# Patient Record
Sex: Female | Born: 1980
Health system: Southern US, Community
[De-identification: ages and names within clinical notes are randomized; demographics above are authoritative.]

## PROBLEM LIST (undated history)

## (undated) DIAGNOSIS — Z789 Other specified health status: Secondary | ICD-10-CM

## (undated) DIAGNOSIS — D509 Iron deficiency anemia, unspecified: Secondary | ICD-10-CM

## (undated) DIAGNOSIS — O99019 Anemia complicating pregnancy, unspecified trimester: Secondary | ICD-10-CM

## (undated) HISTORY — PX: NO PAST SURGERIES: SHX2092

---

## 2010-11-07 NOTE — L&D Delivery Note (Signed)
Delivery Note At 5:07 AM a viable and healthy female was delivered via Vaginal, Spontaneous Delivery (Presentation:ROA ;  ).  APGAR: 9, 9; weight 5 lb 10.3 oz (2560 g).   Placenta status: Intact, Spontaneous.to Path due to IUGR. Light mec noted  Cord: 3 vessels with the following complications: None.  Cord pH: none  Anesthesia: Epidural  Episiotomy: none Lacerations: first Suture Repair: 3.0 chromic Est. Blood Loss (mL): 250cc  Mom to postpartum.  Baby to nursery-stable.  Amy Walsh A 08/18/2011, 5:59 AM

## 2011-01-10 LAB — HEPATITIS B SURFACE ANTIGEN: Hepatitis B Surface Ag: NEGATIVE

## 2011-01-10 LAB — ABO/RH: RH Type: POSITIVE

## 2011-01-10 LAB — ANTIBODY SCREEN: Antibody Screen: NEGATIVE

## 2011-01-10 LAB — HIV ANTIBODY (ROUTINE TESTING W REFLEX): HIV: NONREACTIVE

## 2011-05-19 ENCOUNTER — Inpatient Hospital Stay (HOSPITAL_COMMUNITY): Admission: AD | Admit: 2011-05-19 | Payer: Self-pay | Source: Ambulatory Visit | Admitting: Obstetrics and Gynecology

## 2011-07-26 LAB — STREP B DNA PROBE: GBS: POSITIVE

## 2011-08-05 ENCOUNTER — Other Ambulatory Visit (HOSPITAL_COMMUNITY): Payer: Self-pay | Admitting: Obstetrics and Gynecology

## 2011-08-05 DIAGNOSIS — IMO0002 Reserved for concepts with insufficient information to code with codable children: Secondary | ICD-10-CM

## 2011-08-09 ENCOUNTER — Encounter (HOSPITAL_COMMUNITY): Payer: Self-pay

## 2011-08-09 ENCOUNTER — Other Ambulatory Visit (HOSPITAL_COMMUNITY): Payer: Self-pay | Admitting: Obstetrics and Gynecology

## 2011-08-09 ENCOUNTER — Ambulatory Visit (HOSPITAL_COMMUNITY)
Admission: RE | Admit: 2011-08-09 | Discharge: 2011-08-09 | Disposition: A | Discharge status: Home / Self Care | Payer: 59 | Source: Ambulatory Visit | Attending: Obstetrics and Gynecology | Admitting: Obstetrics and Gynecology

## 2011-08-09 VITALS — BP 141/81 | HR 83 | Wt 145.0 lb

## 2011-08-09 DIAGNOSIS — IMO0002 Reserved for concepts with insufficient information to code with codable children: Secondary | ICD-10-CM

## 2011-08-09 DIAGNOSIS — Z3689 Encounter for other specified antenatal screening: Secondary | ICD-10-CM | POA: Insufficient documentation

## 2011-08-09 DIAGNOSIS — O365990 Maternal care for other known or suspected poor fetal growth, unspecified trimester, not applicable or unspecified: Secondary | ICD-10-CM | POA: Insufficient documentation

## 2011-08-09 NOTE — Progress Notes (Signed)
MATERNAL FETAL MEDICINE CONSULT  Patient Name: Amy Walsh Medical Record Number:  [id number] Date of Birth: 03-09-1981 Requesting Physician Name:  Serita Kyle, MD Date of Service: 08/09/2011  Chief Complaint IUGR    History of Present Illness Amy Walsh was seen today for prenatal diagnosis secondary to IUGR  at the request of Dr. Cherly Hensen.  The patient is a 30 y.o. No obstetric history on file., with an EDD of Not found. dating method.   Review of prior office visits and serial fetal ultrasound studies suggest impaired fetal growth in the third trimester, <10 percentile.  Her prior pregnancy was at term with a 3.1 kg infant, no complications.  She works full time in a somewhat stressful medical lab setting and takes care of her toddler child at home as well.  She dmies any medical issues, does not use ETOH or tobacco or drugs.  Review of Systems A comprehensive review of systems was negative.  Patient History See above     No family history on file. In addition, the patient has no family history of mental retardation, birth defects, or genetic diseases.  Physical Examination There were no vitals filed for this visit. General appearance - alert, well appearing, and in no distress  Ultrasound study: (In AS/EPIC) Single fetus, cephalic presentation, no apparent anatomic abnormalities and normal UA velocimetry and amniotic fluid indices .  Estimated fetal weight is 2.25 kg, < 3% for gestation at 37.5 weeks.  Assessment and Recommendations 1. Suspected IUGR vs. Constitutionally small fetus.  A, I Discussed in detail the implications for risk if the fetus is IUGR, including stillbirth, chronic hypoxia, potential for developmental problems.  Also discussed was the potential for a constitutionally small but otherwise normal fetus at late gestation.  I pointed out that standard of care best  practice is to recommend delivery at 37-[redacted] weeks gestation if EFW is less  than 3%le at early term, and I recommended that approach to her as she is almost [redacted] weeks pregnant.   B. Alternatively, twice weekly NST's and AFI or BPP could be performed up to 39 weeks, especially if the cervix is unfavorable for induction of labor.  With a previous normal term delivery, I pointed out that induction is usually not a problem at 38 weeks.  I spent 20 minutes in face to face consultation with the patient. Thank you for the opportunity to work withDiana     Amy Walsh,Amy Walsh

## 2011-08-09 NOTE — Progress Notes (Signed)
Encounter addended by: Marlana Latus, RN on: 08/09/2011  9:56 AM<BR>     Documentation filed: Episodes

## 2011-08-12 ENCOUNTER — Other Ambulatory Visit (HOSPITAL_COMMUNITY): Payer: Self-pay | Admitting: Obstetrics and Gynecology

## 2011-08-17 ENCOUNTER — Inpatient Hospital Stay (HOSPITAL_COMMUNITY)
Admission: AD | Admit: 2011-08-17 | Discharge: 2011-08-20 | DRG: 775 | Disposition: A | Discharge status: Home / Self Care | Payer: 59 | Source: Ambulatory Visit | Attending: Obstetrics and Gynecology | Admitting: Obstetrics and Gynecology

## 2011-08-17 ENCOUNTER — Encounter (HOSPITAL_COMMUNITY): Payer: Self-pay | Admitting: *Deleted

## 2011-08-17 DIAGNOSIS — D509 Iron deficiency anemia, unspecified: Secondary | ICD-10-CM | POA: Diagnosis present

## 2011-08-17 DIAGNOSIS — O9903 Anemia complicating the puerperium: Secondary | ICD-10-CM | POA: Diagnosis not present

## 2011-08-17 DIAGNOSIS — O365990 Maternal care for other known or suspected poor fetal growth, unspecified trimester, not applicable or unspecified: Secondary | ICD-10-CM | POA: Diagnosis present

## 2011-08-17 DIAGNOSIS — D62 Acute posthemorrhagic anemia: Secondary | ICD-10-CM | POA: Diagnosis not present

## 2011-08-17 HISTORY — DX: Anemia complicating pregnancy, unspecified trimester: O99.019

## 2011-08-17 HISTORY — DX: Other specified health status: Z78.9

## 2011-08-17 HISTORY — DX: Iron deficiency anemia, unspecified: D50.9

## 2011-08-17 LAB — CBC
HCT: 31.8 % — ABNORMAL LOW (ref 36.0–46.0)
Hemoglobin: 10.2 g/dL — ABNORMAL LOW (ref 12.0–15.0)
MCHC: 32.1 g/dL (ref 30.0–36.0)
MCV: 81.5 fL (ref 78.0–100.0)
RDW: 14.5 % (ref 11.5–15.5)

## 2011-08-17 MED ORDER — OXYTOCIN 10 UNIT/ML IJ SOLN: 10.0000 [IU] | Freq: Once | INTRAMUSCULAR | Status: DC

## 2011-08-17 MED ORDER — ACETAMINOPHEN 325 MG PO TABS: 650.0000 mg | ORAL_TABLET | ORAL | Status: DC | PRN

## 2011-08-17 MED ORDER — TERBUTALINE SULFATE 1 MG/ML IJ SOLN: 0.2500 mg | Freq: Once | INTRAMUSCULAR | Status: AC | PRN

## 2011-08-17 MED ORDER — PENICILLIN G POTASSIUM 5000000 UNITS IJ SOLR
5.0000 10*6.[IU] | Freq: Once | INTRAVENOUS | Status: DC
  Administered 2011-08-17: 5 10*6.[IU] via INTRAVENOUS
  Filled 2011-08-17: qty 5

## 2011-08-17 MED ORDER — OXYTOCIN 20 UNITS IN LACTATED RINGERS INFUSION - SIMPLE: 1.0000 m[IU]/min | INTRAVENOUS | Status: DC

## 2011-08-17 MED ORDER — PENICILLIN G POTASSIUM 5000000 UNITS IJ SOLR
2.5000 10*6.[IU] | INTRAVENOUS | Status: DC
  Administered 2011-08-18 (×2): 2.5 10*6.[IU] via INTRAVENOUS
  Filled 2011-08-17 (×5): qty 2.5

## 2011-08-17 MED ORDER — CITRIC ACID-SODIUM CITRATE 334-500 MG/5ML PO SOLN: 30.0000 mL | ORAL | Status: DC | PRN

## 2011-08-17 MED ORDER — ZOLPIDEM TARTRATE 10 MG PO TABS
10.0000 mg | ORAL_TABLET | Freq: Every evening | ORAL | Status: DC | PRN
  Administered 2011-08-17: 10 mg via ORAL
  Filled 2011-08-17: qty 1

## 2011-08-17 MED ORDER — MISOPROSTOL 25 MCG QUARTER TABLET
25.0000 ug | ORAL_TABLET | ORAL | Status: DC | PRN
  Administered 2011-08-17 – 2011-08-18 (×2): 25 ug via VAGINAL
  Filled 2011-08-17 (×2): qty 0.25

## 2011-08-17 MED ORDER — OXYTOCIN 20 UNITS IN LACTATED RINGERS INFUSION - SIMPLE: 125.0000 mL/h | Freq: Once | INTRAVENOUS | Status: DC

## 2011-08-17 MED ORDER — OXYTOCIN BOLUS FROM INFUSION
500.0000 mL | Freq: Once | INTRAVENOUS | Status: DC
  Filled 2011-08-17: qty 500
  Filled 2011-08-17: qty 1000

## 2011-08-17 MED ORDER — LACTATED RINGERS IV SOLN: 500.0000 mL | INTRAVENOUS | Status: DC | PRN

## 2011-08-17 MED ORDER — OXYCODONE-ACETAMINOPHEN 5-325 MG PO TABS: 2.0000 | ORAL_TABLET | ORAL | Status: DC | PRN

## 2011-08-17 MED ORDER — LIDOCAINE HCL (PF) 1 % IJ SOLN
30.0000 mL | INTRAMUSCULAR | Status: DC | PRN
  Administered 2011-08-18: 30 mL via SUBCUTANEOUS
  Filled 2011-08-17: qty 30

## 2011-08-17 MED ORDER — LACTATED RINGERS IV SOLN
INTRAVENOUS | Status: DC
  Administered 2011-08-17 – 2011-08-18 (×2): via INTRAVENOUS

## 2011-08-17 MED ORDER — IBUPROFEN 600 MG PO TABS: 600.0000 mg | ORAL_TABLET | Freq: Four times a day (QID) | ORAL | Status: DC | PRN

## 2011-08-17 MED ORDER — ONDANSETRON HCL 4 MG/2ML IJ SOLN: 4.0000 mg | Freq: Four times a day (QID) | INTRAMUSCULAR | Status: DC | PRN

## 2011-08-17 NOTE — H&P (Signed)
Amy Walsh is a 30 y.o. female  Now @ [redacted] wk gestation presenting for induction 2nd to IUGR. History OB History    Grav Para Term Preterm Abortions TAB SAB Ect Mult Living   2 1 1  0 0 0 0 0 0 1     Past Medical History  Diagnosis Date  . No pertinent past medical history    Past Surgical History  Procedure Date  . No past surgeries    Family History: family history is not on file. Social History:  does not have a smoking history on file. She does not have any smokeless tobacco history on file. Her alcohol and drug histories not on file.  Review of Systems  Genitourinary: Negative.   All other systems reviewed and are negative.    Dilation: 1 Effacement (%): 60 Station: -2 Exam by:: LCarpenter,RN Blood pressure 137/88, pulse 102, temperature 98.8 F (37.1 C), temperature source Oral, resp. rate 18, height 5\' 4"  (1.626 m), weight 146 lb (66.225 kg), last menstrual period 11/18/2010. Exam Physical Exam  Constitutional: She is oriented to person, place, and time. She appears well-developed and well-nourished.  HENT:  Head: Normocephalic.  Cardiovascular: Normal rate and regular rhythm.   Respiratory: Breath sounds normal.  GI: Soft.  Musculoskeletal: Normal range of motion.  Neurological: She is alert and oriented to person, place, and time.  Skin: Skin is warm and dry.    Prenatal labs: ABO, Rh: A/Positive/-- (03/05 0000) Antibody: Negative (03/05 0000) Rubella: Immune (03/05 0000) RPR: Nonreactive (08/08 0000)  HBsAg: Negative (03/05 0000)  HIV: Non-reactive (03/05 0000)  GBS: Positive (09/18 0000)   Assessment/Plan: IUGR GBS cx(+) IUP @39  wk  P) Admit 2) cytotec induction 3) routine labs. 4) PCN prophylaxis   Kirstine Jacquin A 08/17/2011, 9:05 PM

## 2011-08-18 ENCOUNTER — Encounter (HOSPITAL_COMMUNITY): Payer: Self-pay | Admitting: Anesthesiology

## 2011-08-18 ENCOUNTER — Encounter (HOSPITAL_COMMUNITY): Payer: Self-pay | Admitting: *Deleted

## 2011-08-18 ENCOUNTER — Inpatient Hospital Stay (HOSPITAL_COMMUNITY): Payer: 59 | Admitting: Anesthesiology

## 2011-08-18 ENCOUNTER — Other Ambulatory Visit: Payer: Self-pay | Admitting: Obstetrics and Gynecology

## 2011-08-18 MED ORDER — PRENATAL PLUS 27-1 MG PO TABS
1.0000 | ORAL_TABLET | Freq: Every day | ORAL | Status: DC
  Administered 2011-08-18 – 2011-08-20 (×3): 1 via ORAL
  Filled 2011-08-18 (×3): qty 1

## 2011-08-18 MED ORDER — EPHEDRINE 5 MG/ML INJ: 10.0000 mg | INTRAVENOUS | Status: DC | PRN

## 2011-08-18 MED ORDER — LANOLIN HYDROUS EX OINT: TOPICAL_OINTMENT | CUTANEOUS | Status: DC | PRN

## 2011-08-18 MED ORDER — BENZOCAINE-MENTHOL 20-0.5 % EX AERO
1.0000 "application " | INHALATION_SPRAY | CUTANEOUS | Status: DC | PRN
  Filled 2011-08-18: qty 56

## 2011-08-18 MED ORDER — BUTORPHANOL TARTRATE 2 MG/ML IJ SOLN
2.0000 mg | INTRAMUSCULAR | Status: DC | PRN
  Administered 2011-08-18: 2 mg via INTRAVENOUS
  Filled 2011-08-18: qty 1

## 2011-08-18 MED ORDER — PHENYLEPHRINE 40 MCG/ML (10ML) SYRINGE FOR IV PUSH (FOR BLOOD PRESSURE SUPPORT): 80.0000 ug | PREFILLED_SYRINGE | INTRAVENOUS | Status: DC | PRN

## 2011-08-18 MED ORDER — FENTANYL 2.5 MCG/ML BUPIVACAINE 1/10 % EPIDURAL INFUSION (WH - ANES)
14.0000 mL/h | INTRAMUSCULAR | Status: DC
  Filled 2011-08-18: qty 60

## 2011-08-18 MED ORDER — ZOLPIDEM TARTRATE 5 MG PO TABS: 5.0000 mg | ORAL_TABLET | Freq: Every evening | ORAL | Status: DC | PRN

## 2011-08-18 MED ORDER — ONDANSETRON HCL 4 MG/2ML IJ SOLN: 4.0000 mg | INTRAMUSCULAR | Status: DC | PRN

## 2011-08-18 MED ORDER — PHENYLEPHRINE 40 MCG/ML (10ML) SYRINGE FOR IV PUSH (FOR BLOOD PRESSURE SUPPORT)
80.0000 ug | PREFILLED_SYRINGE | INTRAVENOUS | Status: DC | PRN
  Filled 2011-08-18: qty 5

## 2011-08-18 MED ORDER — WITCH HAZEL-GLYCERIN EX PADS: 1.0000 "application " | MEDICATED_PAD | CUTANEOUS | Status: DC | PRN

## 2011-08-18 MED ORDER — DIBUCAINE 1 % RE OINT
1.0000 "application " | TOPICAL_OINTMENT | RECTAL | Status: DC | PRN
  Filled 2011-08-18: qty 28

## 2011-08-18 MED ORDER — ONDANSETRON HCL 4 MG PO TABS: 4.0000 mg | ORAL_TABLET | ORAL | Status: DC | PRN

## 2011-08-18 MED ORDER — SENNOSIDES-DOCUSATE SODIUM 8.6-50 MG PO TABS
2.0000 | ORAL_TABLET | Freq: Every day | ORAL | Status: DC
  Administered 2011-08-18: 2 via ORAL

## 2011-08-18 MED ORDER — FENTANYL 2.5 MCG/ML BUPIVACAINE 1/10 % EPIDURAL INFUSION (WH - ANES)
INTRAMUSCULAR | Status: DC | PRN
  Administered 2011-08-18: 14 mL/h via EPIDURAL

## 2011-08-18 MED ORDER — OXYCODONE-ACETAMINOPHEN 5-325 MG PO TABS: 1.0000 | ORAL_TABLET | ORAL | Status: DC | PRN

## 2011-08-18 MED ORDER — LACTATED RINGERS IV SOLN
500.0000 mL | Freq: Once | INTRAVENOUS | Status: AC
  Administered 2011-08-18: 1000 mL via INTRAVENOUS

## 2011-08-18 MED ORDER — IBUPROFEN 600 MG PO TABS
600.0000 mg | ORAL_TABLET | Freq: Four times a day (QID) | ORAL | Status: DC
  Administered 2011-08-18 – 2011-08-19 (×3): 600 mg via ORAL
  Filled 2011-08-18 (×6): qty 1

## 2011-08-18 MED ORDER — METHYLERGONOVINE MALEATE 0.2 MG/ML IJ SOLN: 0.2000 mg | INTRAMUSCULAR | Status: DC | PRN

## 2011-08-18 MED ORDER — LIDOCAINE HCL 1.5 % IJ SOLN
INTRAMUSCULAR | Status: DC | PRN
  Administered 2011-08-18 (×2): 5 mL via EPIDURAL

## 2011-08-18 MED ORDER — FERROUS SULFATE 325 (65 FE) MG PO TABS
325.0000 mg | ORAL_TABLET | Freq: Two times a day (BID) | ORAL | Status: DC
  Administered 2011-08-18 (×2): 325 mg via ORAL
  Filled 2011-08-18 (×3): qty 1

## 2011-08-18 MED ORDER — EPHEDRINE 5 MG/ML INJ
10.0000 mg | INTRAVENOUS | Status: DC | PRN
  Filled 2011-08-18: qty 4

## 2011-08-18 MED ORDER — MISOPROSTOL 200 MCG PO TABS
800.0000 ug | ORAL_TABLET | Freq: Once | ORAL | Status: AC
  Administered 2011-08-18: 800 ug via RECTAL

## 2011-08-18 MED ORDER — DIPHENHYDRAMINE HCL 50 MG/ML IJ SOLN: 12.5000 mg | INTRAMUSCULAR | Status: DC | PRN

## 2011-08-18 MED ORDER — DIPHENHYDRAMINE HCL 25 MG PO CAPS: 25.0000 mg | ORAL_CAPSULE | Freq: Four times a day (QID) | ORAL | Status: DC | PRN

## 2011-08-18 MED ORDER — TETANUS-DIPHTH-ACELL PERTUSSIS 5-2.5-18.5 LF-MCG/0.5 IM SUSP
0.5000 mL | Freq: Once | INTRAMUSCULAR | Status: AC
  Administered 2011-08-20: 0.5 mL via INTRAMUSCULAR
  Filled 2011-08-18: qty 0.5

## 2011-08-18 MED ORDER — INFLUENZA VIRUS VACC SPLIT PF IM SUSP
0.5000 mL | Freq: Once | INTRAMUSCULAR | Status: DC
  Filled 2011-08-18: qty 0.5

## 2011-08-18 MED ORDER — MISOPROSTOL 200 MCG PO TABS
ORAL_TABLET | ORAL | Status: AC
  Administered 2011-08-18: 800 ug via RECTAL
  Filled 2011-08-18: qty 4

## 2011-08-18 MED ORDER — METHYLERGONOVINE MALEATE 0.2 MG PO TABS: 0.2000 mg | ORAL_TABLET | ORAL | Status: DC | PRN

## 2011-08-18 MED ORDER — SIMETHICONE 80 MG PO CHEW: 80.0000 mg | CHEWABLE_TABLET | ORAL | Status: DC | PRN

## 2011-08-18 NOTE — Progress Notes (Signed)
Encounter addended by: Karleen Dolphin on: 08/18/2011  5:33 PM<BR>     Documentation filed: Notes Section, Charges VN

## 2011-08-18 NOTE — Anesthesia Postprocedure Evaluation (Signed)
Anesthesia Post Note  Patient: Amy Walsh  Procedure(s) Performed: * No procedures listed *  Anesthesia type: Epidural  Patient location: L+D  Post pain: Pain level controlled  Post assessment: Post-op Vital signs reviewed  Last Vitals:  Filed Vitals:   08/18/11 0601  BP: 151/86  Pulse: 114  Temp:   Resp:     Post vital signs: Reviewed  Level of consciousness: awake  Complications: No apparent anesthesia complications pt awaiting transfer when bed available

## 2011-08-18 NOTE — Anesthesia Preprocedure Evaluation (Addendum)
Anesthesia Evaluation  Name, MR# and DOB Patient awake  General Assessment Comment  Reviewed: Allergy & Precautions, H&P , NPO status , Patient's Chart, lab work & pertinent test results  Airway Mallampati: I TM Distance: >3 FB Neck ROM: full    Dental No notable dental hx.    Pulmonary    Pulmonary exam normal       Cardiovascular     Neuro/Psych Negative Neurological ROS  Negative Psych ROS   GI/Hepatic negative GI ROS Neg liver ROS    Endo/Other  Negative Endocrine ROS  Renal/GU negative Renal ROS  Genitourinary negative   Musculoskeletal negative musculoskeletal ROS (+)   Abdominal Normal abdominal exam  (+)   Peds  Hematology negative hematology ROS (+)   Anesthesia Other Findings   Reproductive/Obstetrics (+) Pregnancy                           Anesthesia Physical Anesthesia Plan  ASA: II  Anesthesia Plan: Epidural   Post-op Pain Management:    Induction:   Airway Management Planned:   Additional Equipment:   Intra-op Plan:   Post-operative Plan:   Informed Consent: I have reviewed the patients History and Physical, chart, labs and discussed the procedure including the risks, benefits and alternatives for the proposed anesthesia with the patient or authorized representative who has indicated his/her understanding and acceptance.     Plan Discussed with:   Anesthesia Plan Comments:         Anesthesia Quick Evaluation  

## 2011-08-18 NOTE — Anesthesia Procedure Notes (Signed)
Epidural Patient location during procedure: OB Start time: 08/18/2011 4:03 AM End time: 08/18/2011 4:16 AM Reason for block: procedure for pain  Staffing Anesthesiologist: Sandrea Hughs Performed by: anesthesiologist   Preanesthetic Checklist Completed: patient identified, site marked, surgical consent, pre-op evaluation, timeout performed, IV checked, risks and benefits discussed and monitors and equipment checked  Epidural Patient position: sitting Prep: site prepped and draped and DuraPrep Patient monitoring: continuous pulse ox and blood pressure Approach: midline Injection technique: LOR air  Needle:  Needle type: Tuohy  Needle gauge: 17 G Needle length: 9 cm Needle insertion depth: 4 cm Catheter type: closed end flexible Catheter size: 19 Gauge Catheter at skin depth: 9 cm Test dose: negative and 1.5% lidocaine  Assessment Sensory level: T10 Events: blood not aspirated, injection not painful, no injection resistance, negative IV test and no paresthesia

## 2011-08-18 NOTE — Anesthesia Postprocedure Evaluation (Signed)
  Anesthesia Post-op Note  Patient: Amy Walsh  Procedure(s) Performed: * No procedures listed *  Patient Location: 124  Anesthesia Type: Epidural  Level of Consciousness: awake, alert  and oriented  Airway and Oxygen Therapy: Patient Spontanous Breathing  Post-op Pain: none  Post-op Assessment: Post-op Vital signs reviewed and Patient's Cardiovascular Status Stable  Post-op Vital Signs: Reviewed and stable  Complications: No apparent anesthesia complications

## 2011-08-18 NOTE — Progress Notes (Signed)
Amy Walsh is a 30 y.o. G2P1001 at [redacted]w[redacted]d by LMP admitted for induction of labor due to IUGR. (+) GBS cx. Pt received cytotec x 2.  Subjective: No chief complaint on file.   Objective: BP 147/82  Pulse 88  Temp(Src) 98.8 F (37.1 C) (Oral)  Resp 22  Ht 5\' 4"  (1.626 m)  Wt 146 lb (66.225 kg)  BMI 25.06 kg/m2  LMP 11/18/2010      FHT:  FHR: 130 bpm, variability: minimal ,  accelerations:  Abscent,  decelerations:  Absent UC:   irregular, every 2-3 minutes SVE:   Fully/100/+! Per RN Tracing: nonreative Labs: Lab Results  Component Value Date   WBC 6.1 08/17/2011   HGB 10.2* 08/17/2011   HCT 31.8* 08/17/2011   MCV 81.5 08/17/2011   PLT 220 08/17/2011    Assessment / Plan: IUGR GBS cx (+) on PCN IUP @ 39 wk doing well post cytotec   Anticipated MOD:  NSVD  Verley Pariseau A 08/18/2011, 4:51 AM

## 2011-08-19 ENCOUNTER — Encounter (HOSPITAL_COMMUNITY): Payer: Self-pay

## 2011-08-19 DIAGNOSIS — O99019 Anemia complicating pregnancy, unspecified trimester: Secondary | ICD-10-CM | POA: Diagnosis present

## 2011-08-19 DIAGNOSIS — D509 Iron deficiency anemia, unspecified: Secondary | ICD-10-CM

## 2011-08-19 HISTORY — DX: Iron deficiency anemia, unspecified: D50.9

## 2011-08-19 LAB — CBC
HCT: 26.5 % — ABNORMAL LOW (ref 36.0–46.0)
Hemoglobin: 8.4 g/dL — ABNORMAL LOW (ref 12.0–15.0)
MCHC: 31.7 g/dL (ref 30.0–36.0)
RBC: 3.26 MIL/uL — ABNORMAL LOW (ref 3.87–5.11)
WBC: 7.3 10*3/uL (ref 4.0–10.5)

## 2011-08-19 MED ORDER — INFLUENZA VIRUS VACC SPLIT PF IM SUSP
0.5000 mL | Freq: Once | INTRAMUSCULAR | Status: AC
  Administered 2011-08-20: 0.5 mL via INTRAMUSCULAR
  Filled 2011-08-19: qty 0.5

## 2011-08-19 MED ORDER — DOCUSATE SODIUM 100 MG PO CAPS
100.0000 mg | ORAL_CAPSULE | Freq: Every day | ORAL | Status: DC
  Administered 2011-08-19: 100 mg via ORAL
  Filled 2011-08-19 (×2): qty 1

## 2011-08-19 MED ORDER — POLYSACCHARIDE IRON 150 MG PO CAPS
150.0000 mg | ORAL_CAPSULE | Freq: Every day | ORAL | Status: DC
  Administered 2011-08-19 – 2011-08-20 (×2): 150 mg via ORAL
  Filled 2011-08-19 (×2): qty 1

## 2011-08-19 NOTE — Progress Notes (Addendum)
Post Partum Day #1           Newborn info:  Information for the patient's newborn:  Amy Walsh [[id number]]  female     / feeding breast  Subjective:   Pt reports feeling well/ Tolerating po/ Voiding without problems/ No n/v/ Bleeding is light/ Pain controlled withprescription NSAID's including motrin       Objective:     VS:   Temp:  [98 F (36.7 C)-99.8 F (37.7 C)] 98 F (36.7 C) (10/12 0540) Pulse Rate:  [83-116] 83  (10/12 0540) Resp:  [17-20] 18  (10/12 0540) BP: (108-129)/(51-80) 108/51 mmHg (10/12 0540) SpO2:  [98 %] 98 % (10/11 1955)   No intake or output data in the 24 hours ending 08/19/11 0832       Basename 08/19/11 0538 08/17/11 2030  WBC 7.3 6.1  HGB 8.4* 10.2*  HCT 26.5* 31.8*  PLT 180 220     Blood type: A/Positive/-- (03/05 0000)  Rubella: Immune (03/05 0000)     Physical Exam:   A & O x 3 NAD   Lungs: CTAB  Heart: RR  Abdomen: soft, non-tender, FF @ U-1  Perineum: repair intact 1st deg perineal, edema none  Lochia: small  Extremities: neg Homans', edema neg    Assessment/Plan:  PPD # 1 / 30 y.o., [id number] S/P:induced vaginal, IUGR   Principal Problem:  *Postpartum care following vaginal delivery (10/11) Active Problems:  Maternal iron deficiency anemia complicating pregnancy and ABL anemia superimposed  Started on oral Fe and stool softener.    normal postpartum exam  Doing well  Continue routine post partum orders  Anticipate discharge home in AM.        LOS: 2 days   Amy Walsh, CNM, MSN 08/19/2011, 8:32 AM

## 2011-08-20 MED ORDER — POLYSACCHARIDE IRON 150 MG PO CAPS: 150.0000 mg | ORAL_CAPSULE | Freq: Every day | ORAL | Status: DC

## 2011-08-20 MED ORDER — IBUPROFEN 600 MG PO TABS: 600.0000 mg | ORAL_TABLET | Freq: Four times a day (QID) | ORAL | Status: AC

## 2011-08-20 MED ORDER — PRENATAL PLUS 27-1 MG PO TABS: 1.0000 | ORAL_TABLET | Freq: Every day | ORAL | Status: DC

## 2011-08-20 MED ORDER — DSS 100 MG PO CAPS: 100.0000 mg | ORAL_CAPSULE | Freq: Every day | ORAL | Status: AC

## 2011-08-20 NOTE — Progress Notes (Signed)
  Post Partum Day #2            Information for the patient's newborn:  Larene Beach, Girl Lillybeth [[id number]]  female  Feeding: breast  Subjective: No HA, SOB, CP, F/C, breast symptoms. Pain minimal. Normal vaginal bleeding, no clots.      Objective:  Temp:  [98 F (36.7 C)-98.5 F (36.9 C)] 98.5 F (36.9 C) (10/12 2140) Pulse Rate:  [98-103] 103  (10/12 2140) Resp:  [18] 18  (10/12 2140) BP: (109-111)/(70-76) 109/70 mmHg (10/12 2140)  No intake or output data in the 24 hours ending 08/20/11 1019     Basename 08/19/11 0538 08/17/11 2030  WBC 7.3 6.1  HGB 8.4* 10.2*  HCT 26.5* 31.8*  PLT 180 220    Blood type: A/Positive/-- (03/05 0000) Rubella: Immune (03/05 0000)    Physical Exam:  General: alert, cooperative and no distress Uterine Fundus: firm Lochia: appropriate Perineum: repair intact, edema none DVT Evaluation: Negative Homan's sign. No significant calf/ankle edema.    Assessment/Plan: PPD # 2 / 29 y.o., [id number] S/P:induced vaginal   Principal Problem:  *Postpartum care following vaginal delivery (10/11) Active Problems:  Maternal iron deficiency anemia complicating pregnancy continue oral Fe and stool softeners   normal postpartum exam  Continue current postpartum care  D/C home   LOS: 3 days   PAUL,DANIELA, CNM, MSN 08/20/2011, 10:19 AM

## 2011-08-20 NOTE — Discharge Summary (Signed)
Obstetric Discharge Summary Reason for Admission: induction of labor and IUGR Prenatal Procedures: NST and ultrasound Intrapartum Procedures: spontaneous vaginal delivery Postpartum Procedures: Tdap vaccine Complications-Operative and Postpartum: 1st degree perineal laceration Hemoglobin  Date Value Range Status  08/19/2011 8.4* 12.0-15.0 (g/dL) Final     DELTA CHECK NOTED     REPEATED TO VERIFY     HCT  Date Value Range Status  08/19/2011 26.5* 36.0-46.0 (%) Final    Discharge Diagnoses: Patient Active Problem List  Diagnoses  . Postpartum care following vaginal delivery (10/11)  . Maternal iron deficiency anemia complicating pregnancy      Discharge Information: Date: 08/20/2011 Activity: pelvic rest Diet: routine Medications: PNV, Ibuprofen, Colace and Iron Condition: stable Instructions: refer to practice specific booklet Discharge to: home Follow-up Information    Follow up with COUSINS,SHERONETTE A, MD in 6 weeks.   Contact information:   [address] [place] [address] [phone]          Newborn Data: Live born female  Birth Weight: 5 lb 10.3 oz (2560 g) APGAR: 9, 9  Home with mother.  PAUL,DANIELA 08/20/2011, 10:22 AM

## 2013-06-14 LAB — OB RESULTS CONSOLE HIV ANTIBODY (ROUTINE TESTING): HIV: NONREACTIVE

## 2013-06-14 LAB — OB RESULTS CONSOLE ANTIBODY SCREEN: ANTIBODY SCREEN: NEGATIVE

## 2013-06-14 LAB — OB RESULTS CONSOLE HEPATITIS B SURFACE ANTIGEN: HEP B S AG: NEGATIVE

## 2013-06-14 LAB — OB RESULTS CONSOLE RUBELLA ANTIBODY, IGM: Rubella: IMMUNE

## 2013-06-14 LAB — OB RESULTS CONSOLE ABO/RH: RH TYPE: POSITIVE

## 2013-06-14 LAB — OB RESULTS CONSOLE RPR: RPR: NONREACTIVE

## 2013-06-20 LAB — OB RESULTS CONSOLE GC/CHLAMYDIA
Chlamydia: NEGATIVE
Gonorrhea: NEGATIVE

## 2013-11-07 NOTE — L&D Delivery Note (Signed)
Delivery Note At 3:44 AM a viable and healthy female was delivered via Vaginal, Spontaneous Delivery (Presentation: ; Occiput Anterior).  APGAR:9 ,9 ; weight .  pending Placenta status: Intact, Spontaneous.  Not sent Cord:  CAN x 2 reducible 3 vessels with the following complications: None.  Cord pH: none  Anesthesia: Epidural  Episiotomy: None Lacerations: None Suture Repair: n/a Est. Blood Loss (mL): 200  Mom to postpartum.  Baby to Couplet care / Skin to Skin.  Shelda Truby A 12/19/2013, 4:07 AM

## 2013-12-04 LAB — OB RESULTS CONSOLE GBS: STREP GROUP B AG: NEGATIVE

## 2013-12-17 ENCOUNTER — Other Ambulatory Visit: Payer: Self-pay | Admitting: Obstetrics and Gynecology

## 2013-12-18 ENCOUNTER — Encounter (HOSPITAL_COMMUNITY): Payer: Self-pay | Admitting: *Deleted

## 2013-12-18 ENCOUNTER — Inpatient Hospital Stay (HOSPITAL_COMMUNITY)
Admission: AD | Admit: 2013-12-18 | Discharge: 2013-12-20 | DRG: 775 | Disposition: A | Discharge status: Home / Self Care | Payer: 59 | Source: Ambulatory Visit | Attending: Obstetrics and Gynecology | Admitting: Obstetrics and Gynecology

## 2013-12-18 DIAGNOSIS — O9989 Other specified diseases and conditions complicating pregnancy, childbirth and the puerperium: Secondary | ICD-10-CM

## 2013-12-18 DIAGNOSIS — IMO0002 Reserved for concepts with insufficient information to code with codable children: Secondary | ICD-10-CM

## 2013-12-18 DIAGNOSIS — O99019 Anemia complicating pregnancy, unspecified trimester: Secondary | ICD-10-CM

## 2013-12-18 DIAGNOSIS — D509 Iron deficiency anemia, unspecified: Secondary | ICD-10-CM

## 2013-12-18 DIAGNOSIS — Z22330 Carrier of Group B streptococcus: Secondary | ICD-10-CM

## 2013-12-18 DIAGNOSIS — O99892 Other specified diseases and conditions complicating childbirth: Secondary | ICD-10-CM | POA: Diagnosis present

## 2013-12-18 DIAGNOSIS — IMO0001 Reserved for inherently not codable concepts without codable children: Secondary | ICD-10-CM

## 2013-12-18 LAB — CBC
HCT: 31.6 % — ABNORMAL LOW (ref 36.0–46.0)
HEMOGLOBIN: 10.1 g/dL — AB (ref 12.0–15.0)
MCH: 24.8 pg — ABNORMAL LOW (ref 26.0–34.0)
MCHC: 32 g/dL (ref 30.0–36.0)
MCV: 77.5 fL — ABNORMAL LOW (ref 78.0–100.0)
PLATELETS: 266 10*3/uL (ref 150–400)
RBC: 4.08 MIL/uL (ref 3.87–5.11)
RDW: 14.7 % (ref 11.5–15.5)
WBC: 6.3 10*3/uL (ref 4.0–10.5)

## 2013-12-18 LAB — TYPE AND SCREEN
ABO/RH(D): A POS
Antibody Screen: NEGATIVE

## 2013-12-18 MED ORDER — MISOPROSTOL 200 MCG PO TABS
50.0000 ug | ORAL_TABLET | ORAL | Status: DC
  Administered 2013-12-18: 50 ug via ORAL
  Filled 2013-12-18: qty 0.5

## 2013-12-18 MED ORDER — EPHEDRINE 5 MG/ML INJ
10.0000 mg | INTRAVENOUS | Status: DC | PRN
  Filled 2013-12-18: qty 4
  Filled 2013-12-18: qty 2

## 2013-12-18 MED ORDER — PHENYLEPHRINE 40 MCG/ML (10ML) SYRINGE FOR IV PUSH (FOR BLOOD PRESSURE SUPPORT)
80.0000 ug | PREFILLED_SYRINGE | INTRAVENOUS | Status: DC | PRN
  Filled 2013-12-18: qty 2

## 2013-12-18 MED ORDER — OXYCODONE-ACETAMINOPHEN 5-325 MG PO TABS: 1.0000 | ORAL_TABLET | ORAL | Status: DC | PRN

## 2013-12-18 MED ORDER — BUTORPHANOL TARTRATE 1 MG/ML IJ SOLN
1.0000 mg | Freq: Once | INTRAMUSCULAR | Status: AC
  Administered 2013-12-18: 1 mg via INTRAVENOUS
  Filled 2013-12-18: qty 1

## 2013-12-18 MED ORDER — CITRIC ACID-SODIUM CITRATE 334-500 MG/5ML PO SOLN
30.0000 mL | ORAL | Status: DC | PRN
Start: 2013-12-18 — End: 2013-12-19

## 2013-12-18 MED ORDER — DIPHENHYDRAMINE HCL 50 MG/ML IJ SOLN: 12.5000 mg | INTRAMUSCULAR | Status: DC | PRN

## 2013-12-18 MED ORDER — OXYTOCIN BOLUS FROM INFUSION: 500.0000 mL | INTRAVENOUS | Status: DC

## 2013-12-18 MED ORDER — MISOPROSTOL 25 MCG QUARTER TABLET: 50.0000 ug | ORAL_TABLET | ORAL | Status: DC

## 2013-12-18 MED ORDER — ONDANSETRON HCL 4 MG/2ML IJ SOLN
4.0000 mg | Freq: Four times a day (QID) | INTRAMUSCULAR | Status: DC | PRN
Start: 2013-12-18 — End: 2013-12-19

## 2013-12-18 MED ORDER — PHENYLEPHRINE 40 MCG/ML (10ML) SYRINGE FOR IV PUSH (FOR BLOOD PRESSURE SUPPORT)
80.0000 ug | PREFILLED_SYRINGE | INTRAVENOUS | Status: DC | PRN
  Filled 2013-12-18: qty 10
  Filled 2013-12-18: qty 2

## 2013-12-18 MED ORDER — TERBUTALINE SULFATE 1 MG/ML IJ SOLN: 0.2500 mg | Freq: Once | INTRAMUSCULAR | Status: AC | PRN

## 2013-12-18 MED ORDER — LACTATED RINGERS IV SOLN: 500.0000 mL | INTRAVENOUS | Status: DC | PRN

## 2013-12-18 MED ORDER — LACTATED RINGERS IV SOLN
INTRAVENOUS | Status: DC
  Administered 2013-12-19: 03:00:00 via INTRAVENOUS

## 2013-12-18 MED ORDER — LACTATED RINGERS IV SOLN: 500.0000 mL | Freq: Once | INTRAVENOUS | Status: DC

## 2013-12-18 MED ORDER — FENTANYL 2.5 MCG/ML BUPIVACAINE 1/10 % EPIDURAL INFUSION (WH - ANES)
14.0000 mL/h | INTRAMUSCULAR | Status: DC | PRN
  Filled 2013-12-18: qty 125

## 2013-12-18 MED ORDER — OXYTOCIN 40 UNITS IN LACTATED RINGERS INFUSION - SIMPLE MED: 1.0000 m[IU]/min | INTRAVENOUS | Status: DC

## 2013-12-18 MED ORDER — ACETAMINOPHEN 325 MG PO TABS
650.0000 mg | ORAL_TABLET | ORAL | Status: DC | PRN
Start: 2013-12-18 — End: 2013-12-19

## 2013-12-18 MED ORDER — LIDOCAINE HCL (PF) 1 % IJ SOLN
30.0000 mL | INTRAMUSCULAR | Status: DC | PRN
  Filled 2013-12-18: qty 30

## 2013-12-18 MED ORDER — EPHEDRINE 5 MG/ML INJ
10.0000 mg | INTRAVENOUS | Status: DC | PRN
  Filled 2013-12-18: qty 2

## 2013-12-18 MED ORDER — OXYTOCIN 40 UNITS IN LACTATED RINGERS INFUSION - SIMPLE MED
62.5000 mL/h | INTRAVENOUS | Status: DC
  Administered 2013-12-19: 62.5 mL/h via INTRAVENOUS
  Filled 2013-12-18: qty 1000

## 2013-12-18 MED ORDER — IBUPROFEN 600 MG PO TABS
600.0000 mg | ORAL_TABLET | Freq: Four times a day (QID) | ORAL | Status: DC | PRN
Start: 2013-12-18 — End: 2013-12-19

## 2013-12-18 MED ORDER — FLEET ENEMA 7-19 GM/118ML RE ENEM: 1.0000 | ENEMA | RECTAL | Status: DC | PRN

## 2013-12-18 NOTE — H&P (Signed)
Amy Walsh is a 33 y.o. female presenting for admission 2nd to SROM clear fluid @ 3: 30 PM. occ ctx  Maternal Medical History:  Reason for admission: Rupture of membranes.   Contractions: Onset was 6-12 hours ago.    Fetal activity: Perceived fetal activity is normal.    Prenatal complications: no prenatal complications Prenatal Complications - Diabetes: none.    OB History   Grav Para Term Preterm Abortions TAB SAB Ect Mult Living   3 2 2  0 0 0 0 0 0 2     Past Medical History  Diagnosis Date  . No pertinent past medical history   . Postpartum care following vaginal delivery (10/11) 08/18/2011  . Maternal iron deficiency anemia complicating pregnancy 48/54/6270   Past Surgical History  Procedure Laterality Date  . No past surgeries     Family History: family history is not on file. Social History:  reports that she has never smoked. She does not have any smokeless tobacco history on file. She reports that she does not drink alcohol or use illicit drugs.   Prenatal Transfer Tool  Maternal Diabetes: No Genetic Screening: Declined Maternal Ultrasounds/Referrals: Normal Fetal Ultrasounds or other Referrals:  None Maternal Substance Abuse:  No Significant Maternal Medications:  None Significant Maternal Lab Results:  Lab values include: Group B Strep negative Other Comments:  None  ROS  neg     Blood pressure 131/71, pulse 101, temperature 98 F (36.7 C), temperature source Oral, resp. rate 16, height 5\' 4"  (1.626 m), weight 68.493 kg (151 lb), last menstrual period 03/23/2013, unknown if currently breastfeeding. Maternal Exam:  Uterine Assessment: Contraction strength is mild.  Abdomen: Patient reports no abdominal tenderness. Fetal presentation: vertex  Introitus: Amniotic fluid character: clear.  Pelvis: adequate for delivery.   Cervix: Cervix evaluated by digital exam.    VE 1/thick/-3 vtx  Physical Exam  Constitutional: She is oriented to person,  place, and time. She appears well-developed and well-nourished.  HENT:  Head: Atraumatic.  Respiratory: Breath sounds normal.  Neurological: She is alert and oriented to person, place, and time.  Skin: Skin is warm and dry.    Prenatal labs: ABO, Rh: --/--/A POS (02/11 1950) Antibody: PENDING (02/11 1950) Rubella: Immune (08/08 0000) RPR: Nonreactive (08/08 0000)  HBsAg: Negative (08/08 0000)  HIV: Non-reactive (08/08 0000)  GBS: Negative (01/28 0000)  Tracing: baseline 145 (+) accels Rare ctx  Assessment/Plan: SROM Term gestation  P) admit  Oral cytotec routine labs. Epidural/analgesic prn   Amy Walsh A 12/18/2013, 8:56 PM

## 2013-12-19 ENCOUNTER — Encounter (HOSPITAL_COMMUNITY): Payer: 59 | Admitting: Anesthesiology

## 2013-12-19 ENCOUNTER — Inpatient Hospital Stay (HOSPITAL_COMMUNITY): Payer: 59 | Admitting: Anesthesiology

## 2013-12-19 ENCOUNTER — Encounter (HOSPITAL_COMMUNITY): Payer: Self-pay | Admitting: *Deleted

## 2013-12-19 LAB — ABO/RH: ABO/RH(D): A POS

## 2013-12-19 LAB — RPR: RPR: NONREACTIVE

## 2013-12-19 MED ORDER — ONDANSETRON HCL 4 MG/2ML IJ SOLN: 4.0000 mg | INTRAMUSCULAR | Status: DC | PRN

## 2013-12-19 MED ORDER — SENNOSIDES-DOCUSATE SODIUM 8.6-50 MG PO TABS
2.0000 | ORAL_TABLET | ORAL | Status: DC
  Filled 2013-12-19: qty 2

## 2013-12-19 MED ORDER — ZOLPIDEM TARTRATE 5 MG PO TABS: 5.0000 mg | ORAL_TABLET | Freq: Every evening | ORAL | Status: DC | PRN

## 2013-12-19 MED ORDER — DIBUCAINE 1 % RE OINT
1.0000 "application " | TOPICAL_OINTMENT | RECTAL | Status: DC | PRN
  Administered 2013-12-19: 1 via RECTAL
  Filled 2013-12-19: qty 28

## 2013-12-19 MED ORDER — PRENATAL MULTIVITAMIN CH
1.0000 | ORAL_TABLET | Freq: Every day | ORAL | Status: DC
  Administered 2013-12-19: 1 via ORAL
  Filled 2013-12-19: qty 1

## 2013-12-19 MED ORDER — FENTANYL 2.5 MCG/ML BUPIVACAINE 1/10 % EPIDURAL INFUSION (WH - ANES)
INTRAMUSCULAR | Status: DC | PRN
  Administered 2013-12-19: 14 mL/h via EPIDURAL

## 2013-12-19 MED ORDER — BENZOCAINE-MENTHOL 20-0.5 % EX AERO: 1.0000 "application " | INHALATION_SPRAY | CUTANEOUS | Status: DC | PRN

## 2013-12-19 MED ORDER — WITCH HAZEL-GLYCERIN EX PADS
1.0000 "application " | MEDICATED_PAD | CUTANEOUS | Status: DC | PRN
  Administered 2013-12-19: 1 via TOPICAL

## 2013-12-19 MED ORDER — FERROUS SULFATE 325 (65 FE) MG PO TABS
325.0000 mg | ORAL_TABLET | Freq: Two times a day (BID) | ORAL | Status: DC
  Administered 2013-12-19 – 2013-12-20 (×3): 325 mg via ORAL
  Filled 2013-12-19 (×3): qty 1

## 2013-12-19 MED ORDER — OXYCODONE-ACETAMINOPHEN 5-325 MG PO TABS: 1.0000 | ORAL_TABLET | ORAL | Status: DC | PRN

## 2013-12-19 MED ORDER — SIMETHICONE 80 MG PO CHEW: 80.0000 mg | CHEWABLE_TABLET | ORAL | Status: DC | PRN

## 2013-12-19 MED ORDER — IBUPROFEN 600 MG PO TABS
600.0000 mg | ORAL_TABLET | Freq: Four times a day (QID) | ORAL | Status: DC
  Administered 2013-12-19 – 2013-12-20 (×2): 600 mg via ORAL
  Filled 2013-12-19 (×4): qty 1

## 2013-12-19 MED ORDER — LANOLIN HYDROUS EX OINT: TOPICAL_OINTMENT | CUTANEOUS | Status: DC | PRN

## 2013-12-19 MED ORDER — LIDOCAINE HCL (PF) 1 % IJ SOLN
INTRAMUSCULAR | Status: DC | PRN
  Administered 2013-12-19 (×3): 5 mL

## 2013-12-19 MED ORDER — ONDANSETRON HCL 4 MG PO TABS: 4.0000 mg | ORAL_TABLET | ORAL | Status: DC | PRN

## 2013-12-19 MED ORDER — DIPHENHYDRAMINE HCL 25 MG PO CAPS: 25.0000 mg | ORAL_CAPSULE | Freq: Four times a day (QID) | ORAL | Status: DC | PRN

## 2013-12-19 NOTE — Anesthesia Postprocedure Evaluation (Signed)
  Anesthesia Post-op Note  Patient: Amy Walsh  Procedure(s) Performed: * No procedures listed *  Patient Location: Mother/Baby  Anesthesia Type:Epidural  Level of Consciousness: awake, alert , oriented and patient cooperative  Airway and Oxygen Therapy: Patient Spontanous Breathing  Post-op Pain: mild  Post-op Assessment: Patient's Cardiovascular Status Stable, Respiratory Function Stable, No headache, No backache, No residual numbness and No residual motor weakness  Post-op Vital Signs: stable  Complications: No apparent anesthesia complications

## 2013-12-19 NOTE — Progress Notes (Signed)
S;  C/o some rectal pressure S/p oral cytotec  O: VE per RN: 9.5 cm/0 station Tracing: baseline 140 (+) accel Couplets, triplet ctx  IMP: active phase SROM  P) anticipate svd

## 2013-12-19 NOTE — Lactation Note (Signed)
This note was copied from the chart of [name]. Lactation Consultation Note  Patient Name: [name] [id number] Date: 12/19/2013 Reason for consult: Initial assessment of this experienced multipara who nursed her 2 other babies for 4-8 months and denies any breastfeeding concerns with her newborn, now 55 hours of age.  Initial LATCH score=10 and baby nursed for 15 minutes.  Baby has exclusively breastfed for 10-60 minutes on cue since birth (>10 feeds thus far).   Maternal Data Formula Feeding for Exclusion: No Infant to breast within first hour of birth: Yes (LATCH score=10 and nursed for 15 minutes) Has patient been taught Hand Expression?: Yes (experienced mom) Does the patient have breastfeeding experience prior to this delivery?: Yes  Feeding Feeding Type: Breast Fed Length of feed: 30 min  LATCH Score/Interventions           LATCH scores of 9 and 10 since birth per RN assessment           Lactation Tools Discussed/Used   STS, cue feedings, hand expression  Consult Status Consult Status: Follow-up Date: 12/20/13 Follow-up type: In-patient    Junious Dresser [facility] 12/19/2013, 10:48 PM

## 2013-12-19 NOTE — Anesthesia Procedure Notes (Signed)
Epidural Patient location during procedure: OB  Staffing Anesthesiologist: Montez Hageman Performed by: anesthesiologist   Preanesthetic Checklist Completed: patient identified, site marked, surgical consent, pre-op evaluation, timeout performed, IV checked, risks and benefits discussed and monitors and equipment checked  Epidural Patient position: sitting Prep: ChloraPrep Patient monitoring: heart rate, continuous pulse ox and blood pressure Approach: right paramedian Injection technique: LOR saline  Needle:  Needle type: Tuohy  Needle gauge: 17 G Needle length: 9 cm and 9 Needle insertion depth: 5 cm Catheter type: closed end flexible Catheter size: 20 Guage Catheter at skin depth: 10 cm Test dose: negative  Assessment Events: blood not aspirated, injection not painful, no injection resistance, negative IV test and no paresthesia  Additional Notes   Patient tolerated the insertion well without complications.

## 2013-12-19 NOTE — Anesthesia Preprocedure Evaluation (Signed)
Anesthesia Evaluation  Patient identified by MRN, date of birth, ID band Patient awake    Reviewed: Allergy & Precautions, H&P , NPO status , Patient's Chart, lab work & pertinent test results  Airway Mallampati: I TM Distance: >3 FB Neck ROM: full    Dental no notable dental hx.    Pulmonary neg pulmonary ROS,    Pulmonary exam normal       Cardiovascular negative cardio ROS      Neuro/Psych negative neurological ROS  negative psych ROS   GI/Hepatic negative GI ROS, Neg liver ROS,   Endo/Other  negative endocrine ROS  Renal/GU negative Renal ROS  negative genitourinary   Musculoskeletal negative musculoskeletal ROS (+)   Abdominal Normal abdominal exam  (+)   Peds  Hematology negative hematology ROS (+)   Anesthesia Other Findings   Reproductive/Obstetrics (+) Pregnancy                           Anesthesia Physical  Anesthesia Plan  ASA: II  Anesthesia Plan: Epidural   Post-op Pain Management:    Induction:   Airway Management Planned:   Additional Equipment:   Intra-op Plan:   Post-operative Plan:   Informed Consent: I have reviewed the patients History and Physical, chart, labs and discussed the procedure including the risks, benefits and alternatives for the proposed anesthesia with the patient or authorized representative who has indicated his/her understanding and acceptance.     Plan Discussed with:   Anesthesia Plan Comments:         Anesthesia Quick Evaluation

## 2013-12-20 LAB — CBC
HCT: 29.9 % — ABNORMAL LOW (ref 36.0–46.0)
Hemoglobin: 9.4 g/dL — ABNORMAL LOW (ref 12.0–15.0)
MCH: 24.4 pg — ABNORMAL LOW (ref 26.0–34.0)
MCHC: 31.4 g/dL (ref 30.0–36.0)
MCV: 77.5 fL — AB (ref 78.0–100.0)
PLATELETS: 249 10*3/uL (ref 150–400)
RBC: 3.86 MIL/uL — AB (ref 3.87–5.11)
RDW: 15.1 % (ref 11.5–15.5)
WBC: 8.7 10*3/uL (ref 4.0–10.5)

## 2013-12-20 MED ORDER — FERRALET 90 90-1 MG PO TABS: 1.0000 | ORAL_TABLET | Freq: Every day | ORAL | Status: DC

## 2013-12-20 NOTE — Progress Notes (Signed)
Patient ID: Amy Walsh, female   DOB: 11/28/1980, 33 y.o.   MRN: [id number] PPD # 1  Subjective: Pt reports feeling good and is ready to go home today. Pain controlled with ibuprofen prn.  Tolerating po/ Voiding without problems/ No n/v Bleeding is light/ Newborn info:  Information for the patient's newborn:  Leda Roys Girl Shaniya [[id number]]  female Feeding: breast    Objective:  VS: Blood pressure 109/74, pulse 88, temperature 98.4 F (36.9 C), temperature source Oral, resp. rate 18    Recent Labs  12/18/13 1950 12/20/13 0620  WBC 6.3 8.7  HGB 10.1* 9.4*  HCT 31.6* 29.9*  PLT 266 249    Blood type: --/--/A POS Rubella: Immune    Physical Exam:  General:  alert, cooperative and no distress CV: Regular rate and rhythm Resp: clear Abdomen: soft, nontender, normal bowel sounds Uterine Fundus: firm, below umbilicus, nontender Perineum: is normal Lochia: minimal Ext: extremities normal, atraumatic, no cyanosis or edema    A/P: PPD # 1/ G3P3003/ S/P:spontaneous vaginal Doing well and stable for discharge home RX: Ibuprofen 600mg  po Q 6 hrs prn pain #30 Refill x 1 Niferex 150mg  po QD/BID #30/#60 Refill x 1 Percocet 5/325 1 to 2 po Q 4 hrs prn pain #15 No refill Colace 100mg  po up to TID prn #30 Ref x 1 WOB/GYN booklet given Routine pp visit in [place], MSN, [facility] 12/20/2013, 9:08 AM

## 2013-12-20 NOTE — Discharge Summary (Signed)
Obstetric Discharge Summary Reason for Admission: G3 P2  SROM, GBS cx(+)  IUP @38  5/7 wk  Plan cytotec i, routine labs, and PCN prophylaxis  Prenatal Procedures: none Intrapartum Procedures: spontaneous vaginal delivery Postpartum Procedures: none Complications-Operative and Postpartum: none Hemoglobin  Date Value Ref Range Status  12/20/2013 9.4* 12.0 - 15.0 g/dL Final     HCT  Date Value Ref Range Status  12/20/2013 29.9* 36.0 - 46.0 % Final    Physical Exam:  General: alert and no distress Lochia: appropriate Uterine Fundus: firm Incision: None DVT Evaluation: No evidence of DVT seen on physical exam.  Discharge Diagnoses: G3 P3 s/p FT SVD  Discharge Information: Date: 12/20/2013 Activity: pelvic rest Diet: routine Medications: Ibuprofen and Iron Condition: stable Instructions: refer to practice specific booklet Discharge to: home Follow-up Information   Follow up with Khadijatou Borak A, MD In 6 weeks.   Specialty:  Obstetrics and Gynecology   Contact information:   [address] [place] [address] [phone]       Newborn Data: Live born female  Birth Weight: 6 lb 12.1 oz (3065 g) APGAR: 9, 9  Home with mother.  FISHER,JULIE K 12/20/2013, 9:15 AM

## 2013-12-20 NOTE — Lactation Note (Signed)
This note was copied from the chart of Girl Khamya Topp. Lactation Consultation Note  Patient Name: Girl Avaline Stillson BTDVV'O Date: 12/20/2013 Reason for consult: Follow-up assessment Baby 7 hours old, currently breastfeeding. Mom reports no problems breastfeeding. Mom did ask for comfort gels for nipple soreness. Mom states she is aware of engorgement prevention/treatment measures. She has BF experience, and states she needs no further assistance. Mom aware of OP/BFSG services.   Maternal Data    Feeding Feeding Type: Breast Fed  LATCH Score/Interventions Latch:  (Baby currently breastfeeding, mom is experienced, reports no problems except sore nipples, given comfort gels, reports she needs no further assistance.)  Audible Swallowing: A few with stimulation  Type of Nipple: Everted at rest and after stimulation  Comfort (Breast/Nipple): Filling, red/small blisters or bruises, mild/mod discomfort  Problem noted: Mild/Moderate discomfort  Hold (Positioning): No assistance needed to correctly position infant at breast.  LATCH Score: 8  Lactation Tools Discussed/Used     Consult Status Consult Status: Complete    Lesli Albee, Tekesha Almgren 12/20/2013, 10:01 AM

## 2013-12-21 ENCOUNTER — Inpatient Hospital Stay (HOSPITAL_COMMUNITY): Admission: RE | Admit: 2013-12-21 | Payer: 59 | Source: Ambulatory Visit

## 2014-09-08 ENCOUNTER — Encounter (HOSPITAL_COMMUNITY): Payer: Self-pay | Admitting: *Deleted

## 2017-09-05 DIAGNOSIS — Z30431 Encounter for routine checking of intrauterine contraceptive device: Secondary | ICD-10-CM | POA: Diagnosis not present

## 2017-09-05 DIAGNOSIS — Z5309 Procedure and treatment not carried out because of other contraindication: Secondary | ICD-10-CM | POA: Diagnosis not present

## 2017-09-05 DIAGNOSIS — Z1151 Encounter for screening for human papillomavirus (HPV): Secondary | ICD-10-CM | POA: Diagnosis not present

## 2017-09-05 DIAGNOSIS — Z6824 Body mass index (BMI) 24.0-24.9, adult: Secondary | ICD-10-CM | POA: Diagnosis not present

## 2017-09-05 DIAGNOSIS — Z01419 Encounter for gynecological examination (general) (routine) without abnormal findings: Secondary | ICD-10-CM | POA: Diagnosis not present

## 2017-09-05 LAB — HM PAP SMEAR

## 2017-09-14 DIAGNOSIS — H5213 Myopia, bilateral: Secondary | ICD-10-CM | POA: Diagnosis not present

## 2017-09-14 DIAGNOSIS — H52223 Regular astigmatism, bilateral: Secondary | ICD-10-CM | POA: Diagnosis not present

## 2017-09-21 DIAGNOSIS — Z1329 Encounter for screening for other suspected endocrine disorder: Secondary | ICD-10-CM | POA: Diagnosis not present

## 2017-09-21 DIAGNOSIS — Z130 Encounter for screening for diseases of the blood and blood-forming organs and certain disorders involving the immune mechanism: Secondary | ICD-10-CM | POA: Diagnosis not present

## 2017-09-21 DIAGNOSIS — Z13220 Encounter for screening for lipoid disorders: Secondary | ICD-10-CM | POA: Diagnosis not present

## 2017-09-21 DIAGNOSIS — Z131 Encounter for screening for diabetes mellitus: Secondary | ICD-10-CM | POA: Diagnosis not present

## 2017-09-21 DIAGNOSIS — Z0000 Encounter for general adult medical examination without abnormal findings: Secondary | ICD-10-CM | POA: Diagnosis not present

## 2017-10-02 DIAGNOSIS — T8332XA Displacement of intrauterine contraceptive device, initial encounter: Secondary | ICD-10-CM | POA: Diagnosis not present

## 2017-10-02 DIAGNOSIS — Z3202 Encounter for pregnancy test, result negative: Secondary | ICD-10-CM | POA: Diagnosis not present

## 2017-10-02 DIAGNOSIS — Z30433 Encounter for removal and reinsertion of intrauterine contraceptive device: Secondary | ICD-10-CM | POA: Diagnosis not present

## 2017-10-02 MED FILL — IBUPROFEN 800 MG TABS: 800 | 10 days supply | Qty: 30 | Fill #0

## 2017-10-02 MED FILL — PROMETHAZINE 25 MG TABLET: 25 | 2 days supply | Qty: 12 | Fill #0

## 2017-10-02 MED FILL — OXYCODONE-ACETAMINOPHEN 5-3: 5-325 | 2 days supply | Qty: 10 | Fill #0

## 2017-10-10 MED FILL — AMOXICILLIN 875 MG TABLET: 875 | 10 days supply | Qty: 20 | Fill #0

## 2018-01-02 ENCOUNTER — Ambulatory Visit (INDEPENDENT_AMBULATORY_CARE_PROVIDER_SITE_OTHER): Payer: Self-pay | Admitting: Emergency Medicine

## 2018-01-02 ENCOUNTER — Encounter: Payer: Self-pay | Admitting: Emergency Medicine

## 2018-01-02 VITALS — BP 100/60 | HR 115 | Temp 99.7°F | Resp 16 | Wt 144.6 lb

## 2018-01-02 DIAGNOSIS — R6889 Other general symptoms and signs: Secondary | ICD-10-CM

## 2018-01-02 MED ORDER — OSELTAMIVIR PHOSPHATE 75 MG PO CAPS: 75.0000 mg | ORAL_CAPSULE | Freq: Two times a day (BID) | ORAL | 0 refills | Status: DC

## 2018-01-02 MED ORDER — BENZONATATE 100 MG PO CAPS: 100.0000 mg | ORAL_CAPSULE | Freq: Three times a day (TID) | ORAL | 0 refills | Status: DC | PRN

## 2018-01-02 MED FILL — OSELTAMIVIR PHOSPHATE 75 MG: 75 | 5 days supply | Qty: 10 | Fill #0

## 2018-01-02 MED FILL — BENZONATATE 100 MG CAPS: 100 | 10 days supply | Qty: 30 | Fill #0

## 2018-01-02 NOTE — Progress Notes (Signed)
S: Amy Walsh is a 37 y.o. female who presents with flu like symptoms for 24 hours to include, fever of 101, nausea, vomiting, weakness, non-productive cough, myalgias, and sore throat. 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 Has had the flu shot this year, does not smoke, no hx of asthma or other chronic lung disease. Treatments have included tylenol at 7 am this morning. Otherwise reports to be in generally good health.   Review of Systems  Constitutional: Positive for chills, fever and malaise/fatigue.  HENT: Positive for congestion and sore throat.   Respiratory: Positive for cough. Negative for shortness of breath and wheezing.   Cardiovascular: Negative.   Gastrointestinal: Positive for nausea and vomiting. Negative for abdominal pain and constipation.  Musculoskeletal: Positive for myalgias.  Neurological: Negative.    O: Vitals:   01/02/18 0819  BP: 100/60  Pulse: (!) 115  Temp: 99.7 F (37.6 C)  SpO2: 100%   Physical Exam  Constitutional: She appears well-developed and well-nourished. She appears ill. No distress.  HENT:  Head: Normocephalic and atraumatic.  Right Ear: External ear normal.  Left Ear: External ear normal.  Nose: Nose normal.  Mouth/Throat: Oropharynx is clear and moist.  Eyes: Conjunctivae are normal.  Neck: Normal range of motion.  Cardiovascular: Regular rhythm. Tachycardia present.  Pulmonary/Chest: Effort normal and breath sounds normal.  Lymphadenopathy:    She has no cervical adenopathy.  Neurological: She is alert.  Skin: Skin is warm. Capillary refill takes less than 2 seconds. She is not diaphoretic.  Psychiatric: She has a normal mood and affect.  Nursing note and vitals reviewed.   A: 1. Flu-like symptoms     P 1. Flu-like symptoms -Tylenol or motrin, rest, fluids, Mucinex DM, follow up as needed, ER if worse. - oseltamivir (TAMIFLU) 75 MG capsule; Take 1 capsule (75 mg total) by mouth 2 (two) times daily.  Dispense: 10  capsule; Refill: 0 - benzonatate (TESSALON PERLES) 100 MG capsule; Take 1 capsule (100 mg total) by mouth 3 (three) times daily as needed.  Dispense: 30 capsule; Refill: 0

## 2018-01-02 NOTE — Patient Instructions (Signed)

## 2018-04-26 ENCOUNTER — Encounter: Payer: Self-pay | Admitting: Family Medicine

## 2018-04-26 ENCOUNTER — Ambulatory Visit: Payer: No Typology Code available for payment source | Admitting: Family Medicine

## 2018-05-02 ENCOUNTER — Encounter: Payer: Self-pay | Admitting: Family Medicine

## 2018-05-02 ENCOUNTER — Ambulatory Visit (INDEPENDENT_AMBULATORY_CARE_PROVIDER_SITE_OTHER): Payer: No Typology Code available for payment source | Admitting: Family Medicine

## 2018-05-02 VITALS — BP 98/62 | HR 80 | Temp 98.7°F | Resp 12 | Ht 64.0 in | Wt 142.0 lb

## 2018-05-02 DIAGNOSIS — R3121 Asymptomatic microscopic hematuria: Secondary | ICD-10-CM

## 2018-05-02 DIAGNOSIS — Z0000 Encounter for general adult medical examination without abnormal findings: Secondary | ICD-10-CM

## 2018-05-02 DIAGNOSIS — R2241 Localized swelling, mass and lump, right lower limb: Secondary | ICD-10-CM

## 2018-05-02 LAB — URINALYSIS, ROUTINE W REFLEX MICROSCOPIC
BACTERIA UA: NONE SEEN /HPF
Bilirubin Urine: NEGATIVE
GLUCOSE, UA: NEGATIVE
Ketones, ur: NEGATIVE
Leukocytes, UA: NEGATIVE
Nitrite: NEGATIVE
Protein, ur: NEGATIVE
Specific Gravity, Urine: 1.025 (ref 1.001–1.03)
WBC, UA: NONE SEEN /HPF (ref 0–5)
pH: 5.5 (ref 5.0–8.0)

## 2018-05-02 LAB — MICROSCOPIC MESSAGE

## 2018-05-02 NOTE — Progress Notes (Signed)
Patient: Amy Walsh, Female    DOB: April 17, 1981, 37 y.o.   MRN: [id number] Visit Date: 05/03/2018  Today's Provider: Delsa Grana, PA-C    Chief Complaint  Patient presents with  . New Patient (Initial Visit)  . Annual Exam    pt had juice but no food - No pap has GYN   Subjective:    Annual physical exam Amy Walsh is a 37 y.o. female who presents today for health maintenance and complete physical. She feels well. She reports exercising infrequently. She reports she is sleeping well.  -----------------------------------------------------------------  Past medical hx reviewed, denies any MHx other than OBGYN hx.  Surgical, Family, and social history reviewed and updated.  LMP unknown, Mirena placed 10/2017, PAP done 08/2017, reported to be normal. OBGYN is Dr. Garwin Brothers will repeat in 3 years per pt.  She is sexually active with one partner, husband for 15 years, monogomous.   She is on no medications. She has two acute complaints - a right dorsal foot nodule over 1st MT, mobile, intermittently painful, throbbing, no change to size, been there 1 year, no skin erythema or swelling. She also is concerned that one week ago she had new left upper eyelid twitching, and its been occuring occasionally since.  No vision changes.   She denies any weight changes, hair or skin changes.   She has no other concerns.  She is only been seen by OB/GYN and has not had a PCP in the area.     Review of Systems  Constitutional: Negative.  Negative for activity change, appetite change, fatigue and unexpected weight change.  HENT: Negative.   Eyes: Negative.   Respiratory: Negative.  Negative for shortness of breath.   Cardiovascular: Negative.  Negative for chest pain, palpitations and leg swelling.  Gastrointestinal: Negative.  Negative for abdominal pain and blood in stool.  Endocrine: Negative.   Genitourinary: Negative.   Musculoskeletal: Negative.  Negative for arthralgias,  gait problem, joint swelling and myalgias.  Skin: Negative.  Negative for color change, pallor and rash.  Allergic/Immunologic: Negative.   Neurological: Negative.  Negative for syncope and weakness.  Hematological: Negative.   Psychiatric/Behavioral: Negative.  Negative for confusion, dysphoric mood, self-injury and suicidal ideas. The patient is not nervous/anxious.   All other systems reviewed and are negative.   Social History      She  reports that she has never smoked. She has never used smokeless tobacco. She reports that she does not drink alcohol or use drugs.       Social History   Socioeconomic History  . Marital status: Married    Spouse name: Not on file  . Number of children: Not on file  . Years of education: Not on file  . Highest education level: Not on file  Occupational History  . Not on file  Social Needs  . Financial resource strain: Not on file  . Food insecurity:    Worry: Not on file    Inability: Not on file  . Transportation needs:    Medical: Not on file    Non-medical: Not on file  Tobacco Use  . Smoking status: Never Smoker  . Smokeless tobacco: Never Used  Substance and Sexual Activity  . Alcohol use: No  . Drug use: No  . Sexual activity: Yes    Birth control/protection: IUD  Lifestyle  . Physical activity:    Days per week: Not on file    Minutes per session: Not on file  .  Stress: Not on file  Relationships  . Social connections:    Talks on phone: Not on file    Gets together: Not on file    Attends religious service: Not on file    Active member of club or organization: Not on file    Attends meetings of clubs or organizations: Not on file    Relationship status: Not on file  Other Topics Concern  . Not on file  Social History Narrative  . Not on file    Past Medical History:  Diagnosis Date  . Maternal iron deficiency anemia complicating pregnancy 93/81/8299  . No pertinent past medical history   . Postpartum care  following vaginal delivery (10/11) 08/18/2011     Patient Active Problem List   Diagnosis Date Noted  . Active labor at term 12/18/2013  . Maternal iron deficiency anemia complicating pregnancy 37/16/9678  . Postpartum care following vaginal delivery (2/12) 08/18/2011  . IUGR (intrauterine growth restriction) 08/09/2011    Past Surgical History:  Procedure Laterality Date  . NO PAST SURGERIES      Family History        Family Status  Relation Name Status  . Mother  (Not Specified)  . Father  (Not Specified)  . Sister  Alive       3 sister all healthy        Her family history includes Arthritis in her mother; Cancer in her mother; Diabetes in her father; Hypertension in her father and mother.      No Known Allergies  No current outpatient medications on file.   Patient Care Team: Delsa Grana, PA-C as PCP - General (Family Medicine)      Objective:   Vitals: BP 98/62   Pulse 80   Temp 98.7 F (37.1 C) (Oral)   Resp 12   Ht 5\' 4"  (1.626 m)   Wt 142 lb (64.4 kg)   LMP  (LMP Unknown)   SpO2 98%   Breastfeeding? No   BMI 24.37 kg/m    Vitals:   05/02/18 1410  BP: 98/62  Pulse: 80  Resp: 12  Temp: 98.7 F (37.1 C)  TempSrc: Oral  SpO2: 98%  Weight: 142 lb (64.4 kg)  Height: 5\' 4"  (1.626 m)     Physical Exam  Constitutional: She is oriented to person, place, and time. She appears well-developed and well-nourished.  Non-toxic appearance. No distress.  HENT:  Head: Normocephalic and atraumatic.  Right Ear: External ear normal.  Left Ear: External ear normal.  Nose: Nose normal.  Mouth/Throat: Uvula is midline, oropharynx is clear and moist and mucous membranes are normal. No oropharyngeal exudate.  Eyes: Pupils are equal, round, and reactive to light. Conjunctivae, EOM and lids are normal. No scleral icterus.  Neck: Normal range of motion and phonation normal. Neck supple. No tracheal deviation present. No thyromegaly present.  Cardiovascular: Normal  rate, regular rhythm, normal heart sounds, intact distal pulses and normal pulses. Exam reveals no gallop and no friction rub.  No murmur heard. Pulses:      Radial pulses are 2+ on the right side, and 2+ on the left side.       Posterior tibial pulses are 2+ on the right side, and 2+ on the left side.  Pulmonary/Chest: Effort normal and breath sounds normal. No stridor. No respiratory distress. She has no wheezes. She has no rhonchi. She has no rales. She exhibits no tenderness.  Abdominal: Soft. Normal appearance and bowel sounds are normal.  She exhibits no distension and no mass. There is no tenderness. There is no rebound and no guarding.  Musculoskeletal: Normal range of motion. She exhibits no edema or deformity.       Right foot: There is normal range of motion, no bony tenderness, normal capillary refill and no crepitus.       Feet:  right dorsal foot circular 1 cm nodule over 1st MT, mobile, ttp.  Does not move with flexion or extension of right great toe. No fluctuance.  No skin edema, erythema or induration.  Skin intact.  Normal sensation, ROM and cap refill.  Lymphadenopathy:    She has no cervical adenopathy.  Neurological: She is alert and oriented to person, place, and time. She exhibits normal muscle tone. Coordination and gait normal.  Skin: Skin is warm, dry and intact. Capillary refill takes less than 2 seconds. No rash noted. She is not diaphoretic. No pallor.  Psychiatric: She has a normal mood and affect. Her speech is normal and behavior is normal. Judgment and thought content normal.  Nursing note and vitals reviewed.    Depression Screen PHQ 2/9 Scores 05/02/2018  PHQ - 2 Score 0  PHQ- 9 Score 0      Assessment & Plan:     Routine Health Maintenance and Physical Exam  Exercise Activities and Dietary recommendations Goals    . Increase physical activity       Immunization History  Administered Date(s) Administered  . Influenza Split 08/20/2011  .  Tdap 08/20/2011    Health Maintenance  Topic Date Due  . INFLUENZA VACCINE  06/07/2018  . PAP SMEAR  08/24/2020  . TETANUS/TDAP  08/19/2021  . HIV Screening  Completed   Requested records from OB/GYN.  Discussed health benefits of physical activity, and encouraged her to engage in regular exercise appropriate for her age and condition.    --------------------------------------------------------------------    ICD-10-CM   1. Routine general medical examination at a health care facility Z00.00 Comprehensive metabolic panel    CBC with Differential    Lipid panel    Urinalysis, Routine w reflex microscopic    Microscopic Message  2. Skin nodule of toe of right foot R22.41 Ambulatory referral to Podiatry       Delsa Grana, PA-C 05/03/18 9:42 PM  [facility]

## 2018-05-02 NOTE — Patient Instructions (Signed)

## 2018-05-03 ENCOUNTER — Encounter: Payer: Self-pay | Admitting: Family Medicine

## 2018-05-03 LAB — CBC WITH DIFFERENTIAL/PLATELET
Basophils Absolute: 12 cells/uL (ref 0–200)
Basophils Relative: 0.3 %
Eosinophils Absolute: 92 cells/uL (ref 15–500)
Eosinophils Relative: 2.3 %
HCT: 36.7 % (ref 35.0–45.0)
Hemoglobin: 12.2 g/dL (ref 11.7–15.5)
LYMPHS ABS: 1656 {cells}/uL (ref 850–3900)
MCH: 28.6 pg (ref 27.0–33.0)
MCHC: 33.2 g/dL (ref 32.0–36.0)
MCV: 86.2 fL (ref 80.0–100.0)
MPV: 9.9 fL (ref 7.5–12.5)
Monocytes Relative: 9.3 %
Neutro Abs: 1868 cells/uL (ref 1500–7800)
Neutrophils Relative %: 46.7 %
Platelets: 304 10*3/uL (ref 140–400)
RBC: 4.26 10*6/uL (ref 3.80–5.10)
RDW: 12.1 % (ref 11.0–15.0)
TOTAL LYMPHOCYTE: 41.4 %
WBC mixed population: 372 cells/uL (ref 200–950)
WBC: 4 10*3/uL (ref 3.8–10.8)

## 2018-05-03 LAB — LIPID PANEL
Cholesterol: 148 mg/dL (ref <200)
HDL: 46 mg/dL — ABNORMAL LOW (ref >50)
LDL Cholesterol (Calc): 93 mg/dL (calc)
Non-HDL Cholesterol (Calc): 102 mg/dL (calc) (ref <130)
Total CHOL/HDL Ratio: 3.2 (calc) (ref <5.0)
Triglycerides: 30 mg/dL (ref <150)

## 2018-05-03 LAB — COMPREHENSIVE METABOLIC PANEL
AG RATIO: 1.4 (calc) (ref 1.0–2.5)
ALKALINE PHOSPHATASE (APISO): 92 U/L (ref 33–115)
ALT: 16 U/L (ref 6–29)
AST: 17 U/L (ref 10–30)
Albumin: 4.2 g/dL (ref 3.6–5.1)
BILIRUBIN TOTAL: 0.5 mg/dL (ref 0.2–1.2)
BUN: 10 mg/dL (ref 7–25)
CALCIUM: 9.7 mg/dL (ref 8.6–10.2)
CO2: 30 mmol/L (ref 20–32)
Chloride: 104 mmol/L (ref 98–110)
Creat: 0.65 mg/dL (ref 0.50–1.10)
Globulin: 3 g/dL (calc) (ref 1.9–3.7)
Glucose, Bld: 82 mg/dL (ref 65–99)
POTASSIUM: 4.3 mmol/L (ref 3.5–5.3)
SODIUM: 138 mmol/L (ref 135–146)
Total Protein: 7.2 g/dL (ref 6.1–8.1)

## 2018-05-03 NOTE — Addendum Note (Signed)
Addended by: Delsa Grana on: 05/03/2018 09:51 PM   Modules accepted: Orders

## 2018-05-03 NOTE — Progress Notes (Signed)
Labs look great.  Your blood sugar was normal per fasting standards so no A1C is required to be added on (we only do that if you are known pre-diabetes, diabetic, or have high fasting sugars or extremely high random sugars).  No more anemia.  Kidney and liver function normal.   Cholesterol is pretty good, except HDL is just slightly low at 47, goal is >50.  Usually this improves with good fats in diet like fish/canola oil or fish oil supplementation.  Urine showed 2+ blood, so we will recheck this in the near future.  Asymptomatic hematuria that does not resolve does need to be worked up.  You can come by in the next several weeks or months and leave another clean catch urine sample.  I will have a future order put in.  I put in a referral to podiatry, however with your insurance [facility]) you have to handle the referral and appointment.  So find a provider in network you want to go to, notify [facility] and arrange appt yourself.  You can let us know if they want any records.  Thank you.

## 2018-05-23 ENCOUNTER — Encounter: Payer: Self-pay | Admitting: Family Medicine

## 2018-05-25 ENCOUNTER — Ambulatory Visit (INDEPENDENT_AMBULATORY_CARE_PROVIDER_SITE_OTHER): Payer: No Typology Code available for payment source

## 2018-05-25 ENCOUNTER — Ambulatory Visit: Payer: No Typology Code available for payment source | Admitting: Podiatry

## 2018-05-25 ENCOUNTER — Encounter: Payer: Self-pay | Admitting: Podiatry

## 2018-05-25 ENCOUNTER — Other Ambulatory Visit: Payer: Self-pay | Admitting: Podiatry

## 2018-05-25 ENCOUNTER — Ambulatory Visit: Payer: No Typology Code available for payment source

## 2018-05-25 VITALS — BP 105/65 | HR 80 | Resp 16

## 2018-05-25 DIAGNOSIS — M799 Soft tissue disorder, unspecified: Secondary | ICD-10-CM

## 2018-05-25 DIAGNOSIS — M79671 Pain in right foot: Secondary | ICD-10-CM | POA: Diagnosis not present

## 2018-05-25 DIAGNOSIS — M779 Enthesopathy, unspecified: Secondary | ICD-10-CM

## 2018-05-25 DIAGNOSIS — M7989 Other specified soft tissue disorders: Secondary | ICD-10-CM

## 2018-05-25 DIAGNOSIS — D492 Neoplasm of unspecified behavior of bone, soft tissue, and skin: Secondary | ICD-10-CM | POA: Diagnosis not present

## 2018-05-25 NOTE — Progress Notes (Signed)
   Subjective:    Patient ID: Amy Walsh, female    DOB: Mar 10, 1981, 37 y.o.   MRN: [id number]  HPI    Review of Systems  All other systems reviewed and are negative.      Objective:   Physical Exam        Assessment & Plan:

## 2018-05-25 NOTE — Patient Instructions (Signed)
Pre-Operative Instructions  Congratulations, you have decided to take an important step towards improving your quality of life.  You can be assured that the doctors and staff at [facility] will be with you every step of the way.  Here are some important things you should know:  1. Plan to be at the surgery center/hospital at least 1 (one) hour prior to your scheduled time, unless otherwise directed by the surgical center/hospital staff.  You must have a responsible adult accompany you, remain during the surgery and drive you home.  Make sure you have directions to the surgical center/hospital to ensure you arrive on time. 2. If you are having surgery at [facility] or [facility], you will need a copy of your medical history and physical form from your family physician within one month prior to the date of surgery. We will give you a form for your primary physician to complete.  3. We make every effort to accommodate the date you request for surgery.  However, there are times where surgery dates or times have to be moved.  We will contact you as soon as possible if a change in schedule is required.   4. No aspirin/ibuprofen for one week before surgery.  If you are on aspirin, any non-steroidal anti-inflammatory medications (Mobic, Aleve, Ibuprofen) should not be taken seven (7) days prior to your surgery.  You make take Tylenol for pain prior to surgery.  5. Medications - If you are taking daily heart and blood pressure medications, seizure, reflux, allergy, asthma, anxiety, pain or diabetes medications, make sure you notify the surgery center/hospital before the day of surgery so they can tell you which medications you should take or avoid the day of surgery. 6. No food or drink after midnight the night before surgery unless directed otherwise by surgical center/hospital staff. 7. No alcoholic beverages 24-hours prior to surgery.  No smoking 24-hours prior or 24-hours after  surgery. 8. Wear loose pants or shorts. They should be loose enough to fit over bandages, boots, and casts. 9. Don't wear slip-on shoes. Sneakers are preferred. 10. Bring your boot with you to the surgery center/hospital.  Also bring crutches or a walker if your physician has prescribed it for you.  If you do not have this equipment, it will be provided for you after surgery. 11. If you have not been contacted by the surgery center/hospital by the day before your surgery, call to confirm the date and time of your surgery. 12. Leave-time from work may vary depending on the type of surgery you have.  Appropriate arrangements should be made prior to surgery with your employer. 13. Prescriptions will be provided immediately following surgery by your doctor.  Fill these as soon as possible after surgery and take the medication as directed. Pain medications will not be refilled on weekends and must be approved by the doctor. 14. Remove nail polish on the operative foot and avoid getting pedicures prior to surgery. 15. Wash the night before surgery.  The night before surgery wash the foot and leg well with water and the antibacterial soap provided. Be sure to pay special attention to beneath the toenails and in between the toes.  Wash for at least three (3) minutes. Rinse thoroughly with water and dry well with a towel.  Perform this wash unless told not to do so by your physician.  Enclosed: 1 Ice pack (please put in freezer the night before surgery)   1 Hibiclens skin cleaner     Pre-op instructions  If you have any questions regarding the instructions, please do not hesitate to call our office.  [place]: [address], [place], [place] [address] -- [phone]  [place]: [address] [place], [place] [address] -- [phone]  [place]: [address]  [place], [place] [address] -- [phone]  [place]: [address], [address], [place], [place] [address] -- [phone]  Website:  [email or web address] 

## 2018-05-28 NOTE — Progress Notes (Signed)
Subjective:   Patient ID: Amy Walsh, female   DOB: 37 y.o.   MRN: [id number]   HPI Patient presents stating that she is had this painful nodule on her right foot that is been present for around 2 years.  States is not changed in size during that time but it is bothersome and it is increasingly hard to walk on.  Patient does not smoke and likes to be active   Review of Systems  All other systems reviewed and are negative.       Objective:  Physical Exam  Constitutional: She appears well-developed and well-nourished.  Cardiovascular: Intact distal pulses.  Pulmonary/Chest: Effort normal.  Musculoskeletal: Normal range of motion.  Neurological: She is alert.  Skin: Skin is warm.  Nursing note and vitals reviewed.   Neurovascular status found to be intact muscle strength is adequate range of motion was found to be within normal limits with patient found to have a small nodule on the dorsum of the right first metatarsal that is movable and very painful when palpated.  Patient states that she has trouble wearing shoe gear and it seems like it is bothering her more.  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  Also possibility for nerve involvement due to the discomfort that she is experiencing associated with it     Plan:  H&P x-ray reviewed and today I discussed treatment options associated with this.  Patient wants this excised and I do think would be in her best interest given its pain that she is having a 2-year history.  I discussed the removal of this and send it to pathology and that we may have to remove a small bit of bone based on what the x-ray looks like.  Patient is allowed to read consent form and we went over at great length the procedure and risk associated with this and alternative treatments.  She understands risk  alternative treatments and signed consent form and is given all preoperative instructions and is encouraged to call us if she should have any questions prior to procedure  X-ray indicates there is a small calcification within the first metatarsal that is most likely follow-up with this but it may be more distal to it and may not be removed at the time of surgery depending on what we find with me to the procedure

## 2018-06-04 ENCOUNTER — Telehealth: Payer: Self-pay | Admitting: *Deleted

## 2018-06-04 NOTE — Telephone Encounter (Signed)
Patient's post op visits have been cancelled.

## 2018-06-04 NOTE — Telephone Encounter (Signed)
"  I'm calling to cancel my surgery that is scheduled for August 13."  Is there a reason why you want to cancel?  "I can't find a good time to take off from work.  I can wait to have it done because it's not an emergency.  I'll call back to reschedule when I find a good time to do it."  I'll let Dr. Paulla Dolly know and I'll cancel it at the surgical center.  I called Caren Griffins at the surgical center and canceled the surgery.

## 2018-06-27 ENCOUNTER — Other Ambulatory Visit: Payer: No Typology Code available for payment source

## 2018-08-30 MED FILL — DOXYLAMINE-PYRIDOXINE 10-10: 10-10 | 30 days supply | Qty: 120 | Fill #0

## 2018-09-10 MED FILL — ONDANSETRON HCL 8 MG TABLET: 8 | 15 days supply | Qty: 30 | Fill #0

## 2018-09-11 MED FILL — VITAFOL GUMMIES: 3.33-0.333- | 30 days supply | Qty: 90 | Fill #0

## 2018-09-22 ENCOUNTER — Encounter: Payer: Self-pay | Admitting: *Deleted

## 2018-09-22 DIAGNOSIS — R7303 Prediabetes: Secondary | ICD-10-CM | POA: Insufficient documentation

## 2018-09-22 DIAGNOSIS — D649 Anemia, unspecified: Secondary | ICD-10-CM

## 2018-09-24 MED FILL — IBUPROFEN 800 MG TAB: 800 | 10 days supply | Qty: 30 | Fill #0

## 2018-09-24 MED FILL — PROMETHAZINE 25 MG TABLET: 25 | 2 days supply | Qty: 12 | Fill #0

## 2018-09-24 MED FILL — DOXYCYCLINE HYC 100 MG CAPS: 100 | 1 days supply | Qty: 2 | Fill #0

## 2018-09-27 MED FILL — OXYCODONE-ACETAMINOPHEN 5-3: 5-325 | 2 days supply | Qty: 10 | Fill #0

## 2018-09-27 MED FILL — ALPRAZolam 0.5 MG TABS: 0.5 | 1 days supply | Qty: 4 | Fill #0

## 2018-11-26 MED FILL — MOMETASONE FUROATE 0.1% OIN: 0.1 | 25 days supply | Qty: 30 | Fill #0

## 2019-01-22 MED FILL — XULANE PATCH: 150-35 | 28 days supply | Qty: 3 | Fill #0

## 2021-05-19 ENCOUNTER — Other Ambulatory Visit (HOSPITAL_COMMUNITY): Payer: Self-pay

## 2021-05-19 MED ORDER — FLUCONAZOLE 150 MG PO TABS
150.0000 mg | ORAL_TABLET | ORAL | 2 refills | Status: DC
  Filled 2021-05-19: qty 1, 1d supply, fill #0

## 2021-05-20 ENCOUNTER — Other Ambulatory Visit (HOSPITAL_COMMUNITY): Payer: Self-pay

## 2021-11-22 NOTE — Progress Notes (Signed)
[place] Phone: [phone] Subjective:   Fontaine No, am serving as a scribe for Dr. Hulan Saas. This visit occurred during the SARS-CoV-2 public health emergency.  Safety protocols were in place, including screening questions prior to the visit, additional usage of staff PPE, and extensive cleaning of exam room while observing appropriate contact time as indicated for disinfecting solutions.  I'm seeing this patient by the request  of:  Delsa Grana, PA-C  CC: right foot pain   [id number]  Amy Walsh is a 41 y.o. female coming in with complaint of right foot pain. Patient having pain over medial 1st metacarpal. Pain occurring 25% of the time but pain is sharp when she has pain. No pattern to her pain. Has not tried any therapies. No pain today. Xray by Dr. Paulla Dolly in 2019.  Patient did have x-rays at that time and was discussed that the possibility was more of a fibroma versus cyst which is her.  We did review the note.  Patient did not want to have surgery and never went back.      Past Medical History:  Diagnosis Date   Maternal iron deficiency anemia complicating pregnancy 62/13/0865   No pertinent past medical history    Postpartum care following vaginal delivery (10/11) 08/18/2011   Past Surgical History:  Procedure Laterality Date   NO PAST SURGERIES     Social History   Socioeconomic History   Marital status: Married    Spouse name: Not on file   Number of children: Not on file   Years of education: Not on file   Highest education level: Not on file  Occupational History   Not on file  Tobacco Use   Smoking status: Never   Smokeless tobacco: Never  Vaping Use   Vaping Use: Never used  Substance and Sexual Activity   Alcohol use: No   Drug use: No   Sexual activity: Yes    Birth control/protection: I.U.D.  Other Topics Concern   Not on file  Social History Narrative    Not on file   Social Determinants of Health   Financial Resource Strain: Not on file  Food Insecurity: Not on file  Transportation Needs: Not on file  Physical Activity: Not on file  Stress: Not on file  Social Connections: Not on file   No Known Allergies Family History  Problem Relation Age of Onset   Hypertension Mother    Arthritis Mother    Cancer Mother    Hypertension Father    Diabetes Father          Current Outpatient Medications (Other):    fluconazole (DIFLUCAN) 150 MG tablet, Take 1 tablet (150 mg total) by mouth as directed.   Reviewed prior external information including notes and imaging from  primary care provider As well as notes that were available from care everywhere and other healthcare systems.  Past medical history, social, surgical and family history all reviewed in electronic medical record.  No pertanent information unless stated regarding to the chief complaint.   Review of Systems:  No headache, visual changes, nausea, vomiting, diarrhea, constipation, dizziness, abdominal pain, skin rash, fevers, chills, night sweats, weight loss, swollen lymph nodes, body aches, joint swelling, chest pain, shortness of breath, mood changes. POSITIVE muscle aches  Objective  Blood pressure 102/76, pulse 88, height 5\' 4"  (1.626 m), weight 148 lb (67.1 kg), SpO2 99 %.   General: No apparent  distress alert and oriented x3 mood and affect normal, dressed appropriately.  HEENT: Pupils equal, extraocular movements intact  Respiratory: Patient's speak in full sentences and does not appear short of breath  Cardiovascular: No lower extremity edema, non tender, no erythema  Gait normal with good balance and coordination.  MSK:  Non tender with full range of motion and good stability and symmetric strength and tone of shoulders, elbows, wrist, hip, knee and ankles bilaterally.   Right foot exam shows the patient on the dorsal aspect does have a mass noted.  Severely  tender to palpation even to light sensation.  No breakdown of the surrounding skin.  This is more over the first metatarsal region but more distal than the midfoot.  Limited muscular skeletal ultrasound was performed and interpreted by Hulan Saas, M  Limited ultrasound of the mass on the dorsal aspect of patient's foot is a hypoechoic changes with some mild debris that could be consistent with a possible ganglion.  Unfortunately patient also has what appears to be vascularity within the mass itself.  This is seen on longitudinal and Coronal imaging. Impression: Foot mass with abnormal vascularity.

## 2021-11-23 ENCOUNTER — Ambulatory Visit (INDEPENDENT_AMBULATORY_CARE_PROVIDER_SITE_OTHER): Payer: No Typology Code available for payment source | Admitting: Family Medicine

## 2021-11-23 ENCOUNTER — Ambulatory Visit: Payer: Self-pay

## 2021-11-23 ENCOUNTER — Other Ambulatory Visit: Payer: Self-pay

## 2021-11-23 VITALS — BP 102/76 | HR 88 | Ht 64.0 in | Wt 148.0 lb

## 2021-11-23 DIAGNOSIS — R2241 Localized swelling, mass and lump, right lower limb: Secondary | ICD-10-CM | POA: Insufficient documentation

## 2021-11-23 DIAGNOSIS — M79671 Pain in right foot: Secondary | ICD-10-CM | POA: Diagnosis not present

## 2021-11-23 DIAGNOSIS — I83811 Varicose veins of right lower extremities with pain: Secondary | ICD-10-CM

## 2021-11-23 NOTE — Patient Instructions (Addendum)
[facility] [phone] Call Today  When we receive your results we will contact you.  

## 2021-11-23 NOTE — Assessment & Plan Note (Signed)
Patient does have what appears to be a right foot mass.  Seems to be on the dorsal aspect approximately on the first metatarsal ray.  On ultrasound does not seem to have any association with the bone but unfortunately does have vascularity noted in the middle of it.  Patient is severely tender to palpation in the area as well.  Does seem to be somewhat compressible.  Discussed with patient that depending on findings this can change medical management.  Due to the abnormal vascularity in the interval slowly growing over the course the last 2 years I do think that advanced imaging is necessary and then depending on findings we will discuss further treatment options.

## 2021-11-24 ENCOUNTER — Ambulatory Visit
Admission: RE | Admit: 2021-11-24 | Discharge: 2021-11-24 | Disposition: A | Discharge status: Home / Self Care | Payer: No Typology Code available for payment source | Source: Ambulatory Visit | Attending: Family Medicine | Admitting: Family Medicine

## 2021-11-24 DIAGNOSIS — M79671 Pain in right foot: Secondary | ICD-10-CM

## 2021-11-24 MED ORDER — GADOBENATE DIMEGLUMINE 529 MG/ML IV SOLN
13.0000 mL | Freq: Once | INTRAVENOUS | Status: AC | PRN
  Administered 2021-11-24: 13 mL via INTRAVENOUS

## 2021-11-24 NOTE — Addendum Note (Signed)
Addended by: Vilma Meckel R on: 11/24/2021 04:58 PM   Modules accepted: Orders

## 2021-11-30 ENCOUNTER — Telehealth: Payer: Self-pay | Admitting: Family Medicine

## 2021-11-30 NOTE — Telephone Encounter (Signed)
Amy Walsh from [facility] called about our referral. Do we want a vascular consult with one of their providers? Do we want some type of imaging?  Please update info in the referral, "foot mass" was not enough for them.

## 2021-11-30 NOTE — Addendum Note (Signed)
Addended by: Carmie Kanner on: 11/30/2021 11:03 AM   Modules accepted: Orders

## 2021-12-01 NOTE — Telephone Encounter (Signed)
Referral notes updated, consult for varicose veins w/ R leg pain.

## 2021-12-16 ENCOUNTER — Other Ambulatory Visit: Payer: Self-pay

## 2021-12-16 DIAGNOSIS — M7989 Other specified soft tissue disorders: Secondary | ICD-10-CM

## 2021-12-21 ENCOUNTER — Other Ambulatory Visit: Payer: Self-pay

## 2021-12-21 ENCOUNTER — Encounter: Payer: Self-pay | Admitting: Vascular Surgery

## 2021-12-21 ENCOUNTER — Ambulatory Visit (INDEPENDENT_AMBULATORY_CARE_PROVIDER_SITE_OTHER): Payer: No Typology Code available for payment source | Admitting: Vascular Surgery

## 2021-12-21 ENCOUNTER — Ambulatory Visit (HOSPITAL_COMMUNITY)
Admission: RE | Admit: 2021-12-21 | Discharge: 2021-12-21 | Disposition: A | Discharge status: Home / Self Care | Payer: No Typology Code available for payment source | Source: Ambulatory Visit | Attending: Vascular Surgery | Admitting: Vascular Surgery

## 2021-12-21 VITALS — BP 124/84 | HR 86 | Temp 98.3°F | Resp 20 | Ht 64.0 in | Wt 147.6 lb

## 2021-12-21 DIAGNOSIS — R2241 Localized swelling, mass and lump, right lower limb: Secondary | ICD-10-CM | POA: Diagnosis not present

## 2021-12-21 DIAGNOSIS — M7989 Other specified soft tissue disorders: Secondary | ICD-10-CM | POA: Diagnosis present

## 2021-12-21 NOTE — Progress Notes (Signed)
Patient name: Amy Walsh MRN: [id number] DOB: 04-Jun-1981 Sex: female  REASON FOR CONSULT: Evaluate varicosity right foot  HPI: Amy Walsh is a 41 y.o. female, with no significant medical history that presents for evaluation of a varicosity on top of her right foot.  She states she has a focal lesion since at least 2017 that has been very painful.  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  Lesion becomes extremely painful and she cannot even touch it.  She did have a recent MRI that suggested this was a venous varix and suggested possible ultrasound to further evaluate.  She denies any associated symptoms of venous insufficiency like leg swelling, achiness, heaviness etc.  She has no history of DVT.  Past Medical History:  Diagnosis Date   Maternal iron deficiency anemia complicating pregnancy 38/08/1750   No pertinent past medical history    Postpartum care following vaginal delivery (10/11) 08/18/2011    Past Surgical History:  Procedure Laterality Date   NO PAST SURGERIES      Family History  Problem Relation Age of Onset   Hypertension Mother    Arthritis Mother    Cancer Mother    Hypertension Father    Diabetes Father     SOCIAL HISTORY: Social History   Socioeconomic History   Marital status: Married    Spouse name: Not on file   Number of children: Not on file   Years of education: Not on file   Highest education level: Not on file  Occupational History   Not on file  Tobacco Use   Smoking status: Never    Passive exposure: Never   Smokeless tobacco: Never  Vaping Use   Vaping Use: Never used  Substance and Sexual Activity   Alcohol use: No   Drug use: No   Sexual activity: Yes    Birth control/protection: I.U.D.  Other Topics Concern   Not on file  Social History Narrative   Not on file   Social Determinants of  Health   Financial Resource Strain: Not on file  Food Insecurity: Not on file  Transportation Needs: Not on file  Physical Activity: Not on file  Stress: Not on file  Social Connections: Not on file  Intimate Partner Violence: Not on file    No Known Allergies  No current outpatient medications on file.   No current facility-administered medications for this visit.    REVIEW OF SYSTEMS:  [X]  denotes positive finding, [ ]  denotes negative finding Cardiac  Comments:  Chest pain or chest pressure:    Shortness of breath upon exertion:    Short of breath when lying flat:    Irregular heart rhythm:        Vascular    Pain in calf, thigh, or hip brought on by ambulation:    Pain in feet at night that wakes you up from your sleep:     Blood clot in your veins:    Leg swelling:         Pulmonary    Oxygen at home:    Productive cough:     Wheezing:         Neurologic    Sudden weakness in arms or legs:     Sudden numbness in arms or legs:     Sudden onset of difficulty  speaking or slurred speech:    Temporary loss of vision in one eye:     Problems with dizziness:         Gastrointestinal    Blood in stool:     Vomited blood:         Genitourinary    Burning when urinating:     Blood in urine:        Psychiatric    Major depression:         Hematologic    Bleeding problems:    Problems with blood clotting too easily:        Skin    Rashes or ulcers:        Constitutional    Fever or chills:      PHYSICAL EXAM: Vitals:   12/21/21 1336  BP: 124/84  Pulse: 86  Resp: 20  Temp: 98.3 F (36.8 C)  TempSrc: Temporal  SpO2: 99%  Weight: 147 lb 9.6 oz (67 kg)  Height: 5\' 4"  (1.626 m)    GENERAL: The patient is a well-nourished female, in no acute distress. The vital signs are documented above. CARDIAC: There is a regular rate and rhythm.  VASCULAR:  Palpable femoral pulses bilaterally Palpable DP pulses bilaterally Tender lesion on the dorsum of the  right foot that is firm and not compressible. PULMONARY: No respiratory distress. ABDOMEN: Soft and non-tender. MUSCULOSKELETAL: There are no major deformities or cyanosis. NEUROLOGIC: No focal weakness or paresthesias are detected. PSYCHIATRIC: The patient has a normal affect.  DATA:   Indications: Pain in top of right foot.      Limitations: Pain at top of foot - cannot compress.     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   Performing Technologist: Ivan Croft      Examination Guidelines: A complete evaluation includes B-mode imaging,  spectral  Doppler, color Doppler, and power Doppler as needed of all accessible  portions  of each vessel. Bilateral testing is considered an integral part of a  complete  examination. Limited examinations for reoccurring indications may be  performed  as noted. The reflux portion of the exam is performed with the patient in  reverse Trendelenburg.  Significant venous reflux is defined as >500 ms in the superficial venous  system, and >1 second in the deep venous system.      Venous Reflux Times  +--------------+---------+------+-----------+------------+--------+   RIGHT          Reflux No Reflux Reflux Time Diameter cms Comments                              Yes                                       +--------------+---------+------+-----------+------------+--------+   CFV            no                                                   +--------------+---------+------+-----------+------------+--------+  FV mid         no                                                   +--------------+---------+------+-----------+------------+--------+   Popliteal      no                                                    +--------------+---------+------+-----------+------------+--------+   GSV at [facility]     no                               0.66                +--------------+---------+------+-----------+------------+--------+   GSV prox thigh no                               0.32                +--------------+---------+------+-----------+------------+--------+   GSV mid thigh  no                               0.24                +--------------+---------+------+-----------+------------+--------+   GSV dist thigh no                               0.38                +--------------+---------+------+-----------+------------+--------+   GSV at knee    no                               0.24                +--------------+---------+------+-----------+------------+--------+   GSV prox calf             yes     >500 ms       0.20                +--------------+---------+------+-----------+------------+--------+   SSV Pop Fossa  no                               0.24                +--------------+---------+------+-----------+------------+--------+   SSV prox calf  no                               0.19                +--------------+---------+------+-----------+------------+--------+   SSV mid calf              yes     >500 ms       0.26                +--------------+---------+------+-----------+------------+--------+  Summary:  Right:  - No evidence of deep vein thrombosis seen in the right lower extremity,  from the common femoral through the popliteal veins.  - No evidence of superficial venous thrombosis in the right lower  extremity.     - Venous reflux is noted in the right greater saphenous vein in the  proximal calf.  - Venous reflux is noted in the right short saphenous vein (mid calf).     *See table(s) above for measurements and observations.   Assessment/Plan:  41 year old female referred for evaluation of possible varicosity on the dorsum of the right foot.  She has a focal lesion since  2017 that has become more tender and painful recently.  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  I have reviewed her MRI imaging and I can certainly appreciate the lesion adjacent to the vasculature on top of the foot.  The MRI questions a venous varix.  We did perform an ultrasound here in the office and it does not appear to have any vascular communication to suggest that this is a varicosity or AVM etc.  In addition it does not look or feel like a varicosity on exam and is firm like a solid mass.  I question whether this could be a neuroma or ganglion cyst.  I think she should be evaluated by a foot and ankle surgeon and I have referred her to Dr. Radene Journey with [facility].  Certainly available to help if any questions or concerns come up in the future but again do not feel this is a varicosity.   Marty Heck, MD [facility] Office: [phone]

## 2022-06-28 ENCOUNTER — Other Ambulatory Visit: Payer: Self-pay | Admitting: Obstetrics and Gynecology

## 2022-06-28 DIAGNOSIS — N6320 Unspecified lump in the left breast, unspecified quadrant: Secondary | ICD-10-CM

## 2022-07-13 ENCOUNTER — Ambulatory Visit
Admission: RE | Admit: 2022-07-13 | Discharge: 2022-07-13 | Disposition: A | Discharge status: Home / Self Care | Payer: No Typology Code available for payment source | Source: Ambulatory Visit | Attending: Obstetrics and Gynecology | Admitting: Obstetrics and Gynecology

## 2022-07-13 ENCOUNTER — Other Ambulatory Visit: Payer: Self-pay | Admitting: Obstetrics and Gynecology

## 2022-07-13 DIAGNOSIS — N6320 Unspecified lump in the left breast, unspecified quadrant: Secondary | ICD-10-CM

## 2022-07-20 ENCOUNTER — Ambulatory Visit
Admission: RE | Admit: 2022-07-20 | Discharge: 2022-07-20 | Disposition: A | Discharge status: Home / Self Care | Payer: No Typology Code available for payment source | Source: Ambulatory Visit | Attending: Obstetrics and Gynecology | Admitting: Obstetrics and Gynecology

## 2022-07-20 DIAGNOSIS — N6320 Unspecified lump in the left breast, unspecified quadrant: Secondary | ICD-10-CM

## 2022-08-04 ENCOUNTER — Ambulatory Visit: Payer: Self-pay | Admitting: General Surgery

## 2022-08-04 DIAGNOSIS — N6489 Other specified disorders of breast: Secondary | ICD-10-CM

## 2023-07-04 DIAGNOSIS — Z01419 Encounter for gynecological examination (general) (routine) without abnormal findings: Secondary | ICD-10-CM | POA: Diagnosis not present

## 2023-07-04 DIAGNOSIS — R7303 Prediabetes: Secondary | ICD-10-CM | POA: Diagnosis not present

## 2023-07-29 IMAGING — MR MR FOOT*R* WO/W CM
8 of 9 series · 36 of 40 positions shown · IV contrast (multihance)
Comparison: None.

CLINICAL DATA: Dorsal medial right foot mass.

EXAM:
MRI OF THE RIGHT FOREFOOT WITHOUT AND WITH CONTRAST
TECHNIQUE: Multiplanar, multisequence MR imaging of the right foot was
performed before and after the administration of intravenous
contrast.
CONTRAST:  13mL MULTIHANCE GADOBENATE DIMEGLUMINE 529 MG/ML IV SOLN

[Series 3: T1 · coronal · right · 3.0mm · 0.31mm/px · 6 of 45 slices shown (1 of 2)]
[im 1/45]
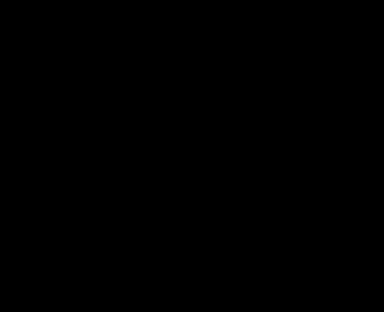
[im 9/45]
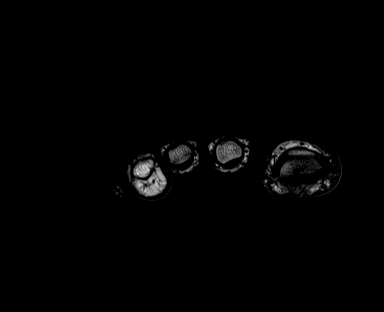
[im 18/45]
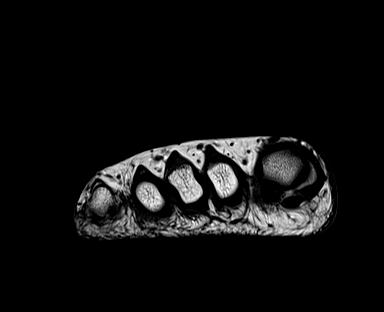
[im 27/45]
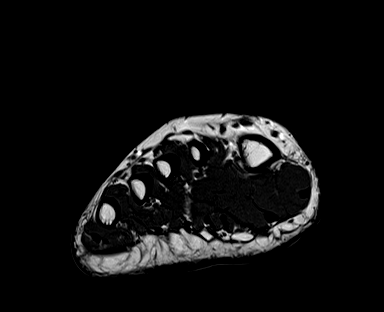
[im 36/45]
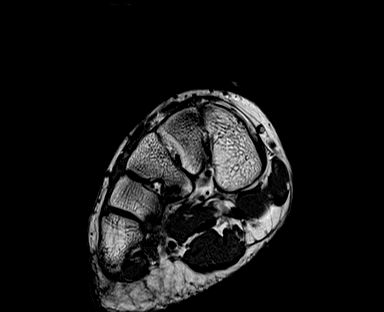
[im 45/45]
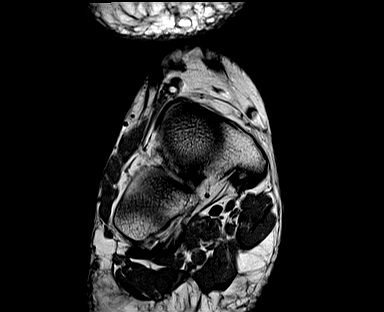

[Series 4: T2 fat-sat · coronal · right · 3.0mm · 0.31mm/px · 6 of 45 slices shown (1 of 2)]
[im 1/45]
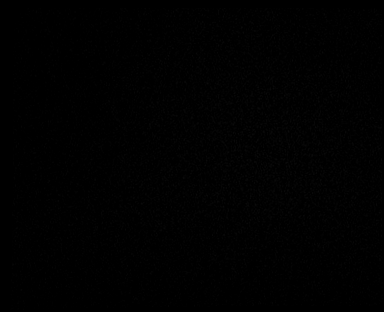
[im 9/45]
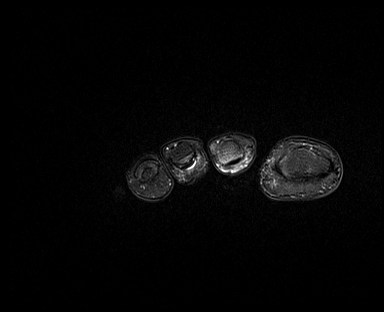
[im 18/45]
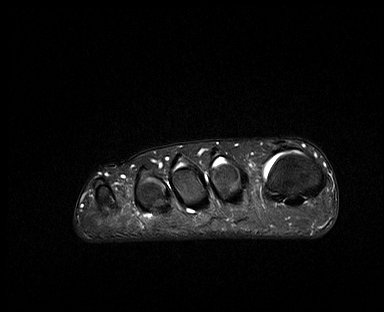
[im 27/45]
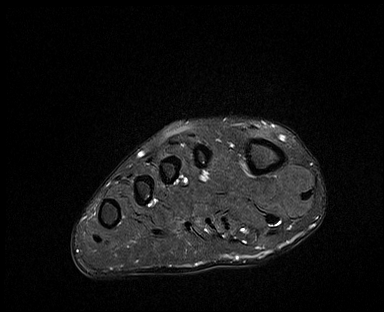
[im 36/45]
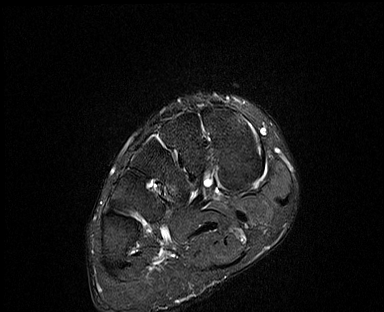
[im 45/45]
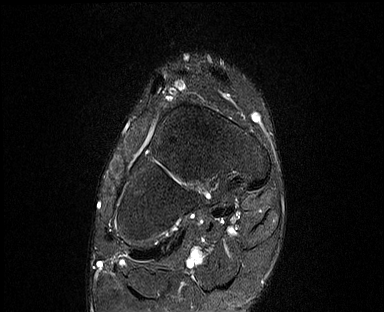

[Series 6: T1 · axial · right · 3.0mm · 0.47mm/px · z∈[-90,-17]mm · 3 of 21 slices shown (2 of 2)]
[im 1/21]
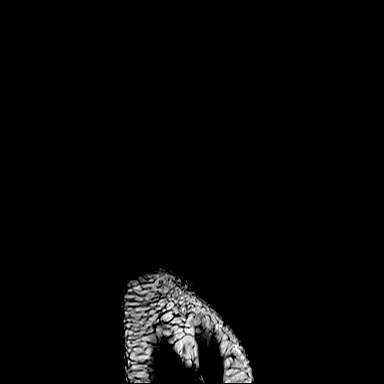
[im 11/21]
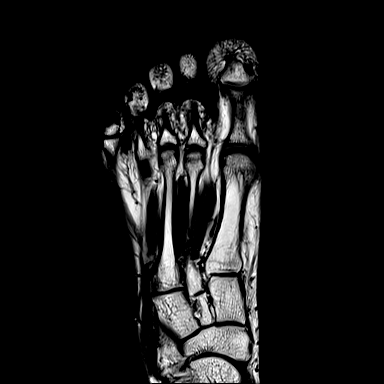
[im 21/21]
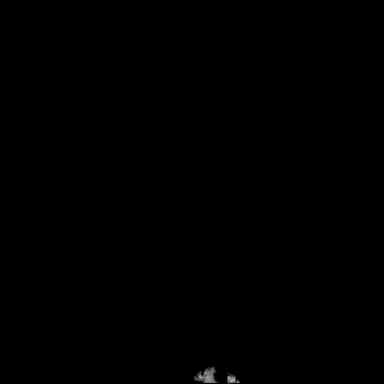

[Series 7: T2 fat-sat · axial · right · 3.0mm · 0.47mm/px · z∈[-90,-17]mm · 3 of 21 slices shown (2 of 2)]
[im 1/21]
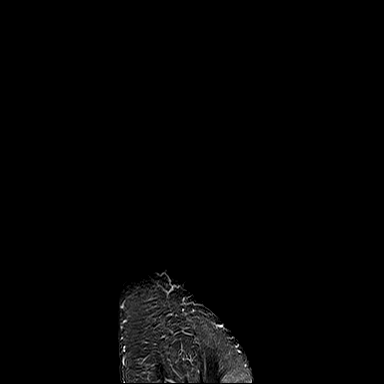
[im 11/21]
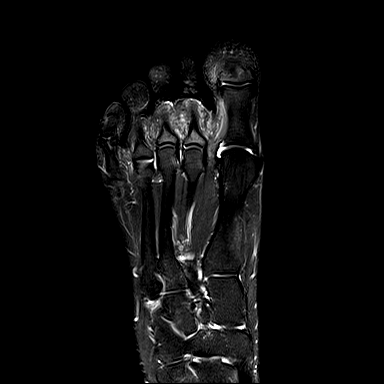
[im 21/21]
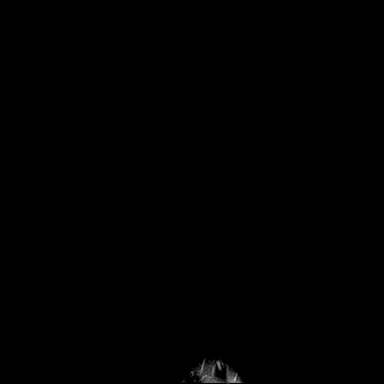

[Series 8: T1 fat-sat · coronal · non-contrast · right · 3.0mm · 0.27mm/px · 6 of 45 slices shown (1 of 3)]
[im 1/45]
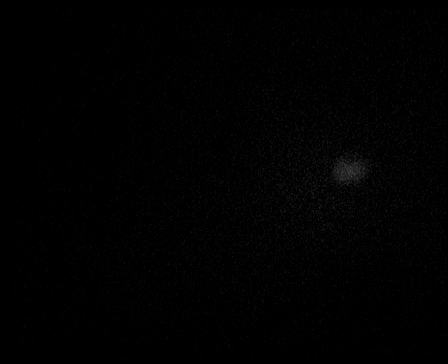
[im 9/45]
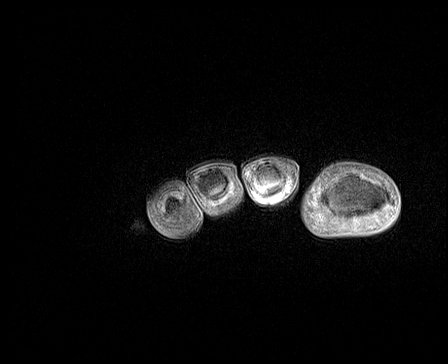
[im 18/45]
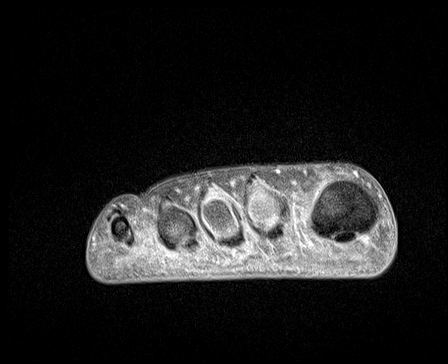
[im 27/45]
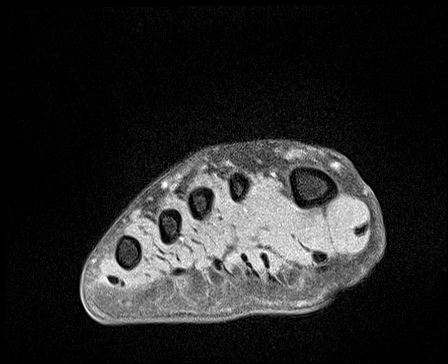
[im 36/45]
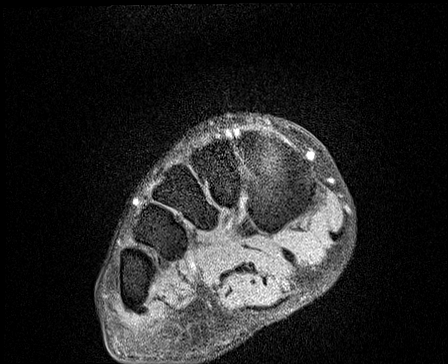
[im 45/45]
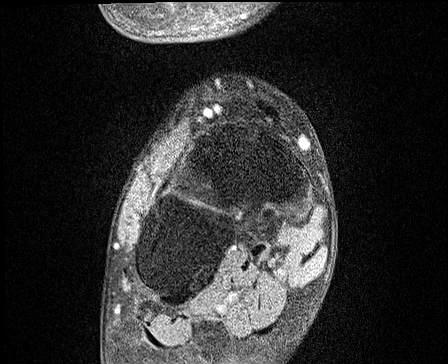

[Series 9: T1 fat-sat post-contrast · coronal · right · 3.0mm · 0.27mm/px · 6 of 45 slices shown]
[im 1/45]
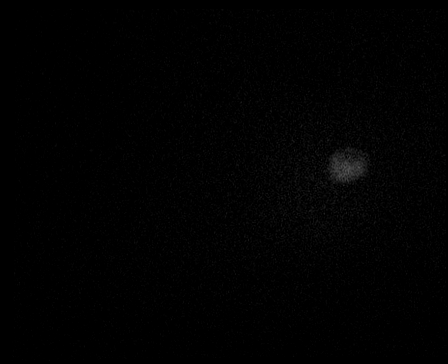
[im 9/45]
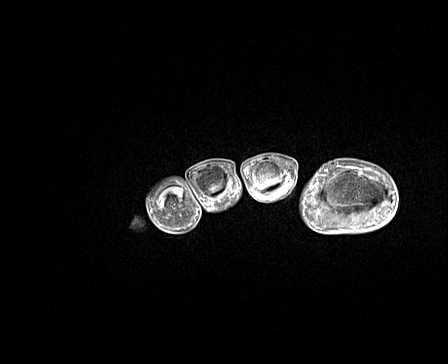
[im 18/45]
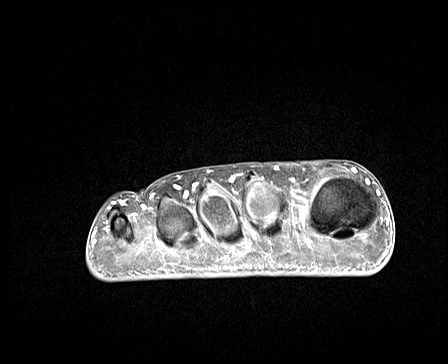
[im 27/45]
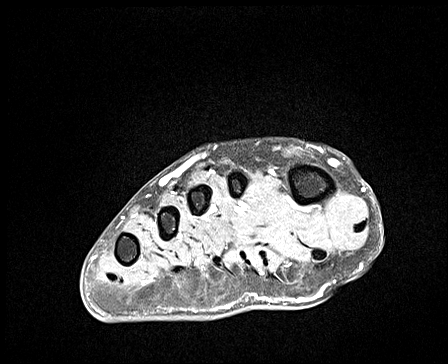
[im 36/45]
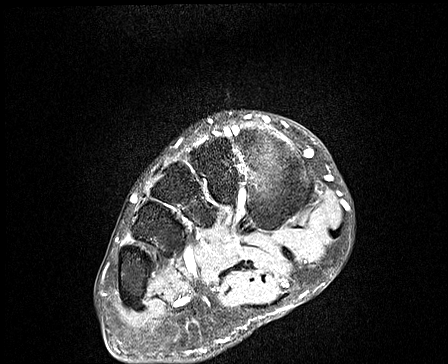
[im 45/45]
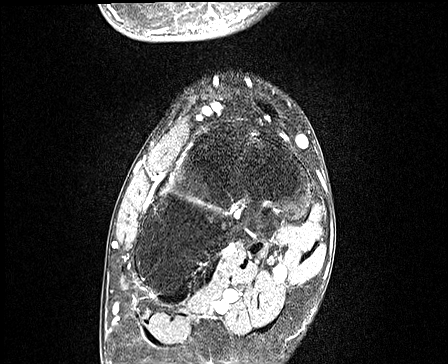

[Series 10: T1 fat-sat · axial · right · 3.0mm · 0.47mm/px · z∈[-90,-17]mm · 3 of 21 slices shown (2 of 3)]
[im 1/21]
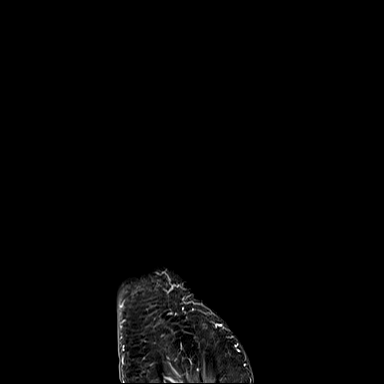
[im 11/21]
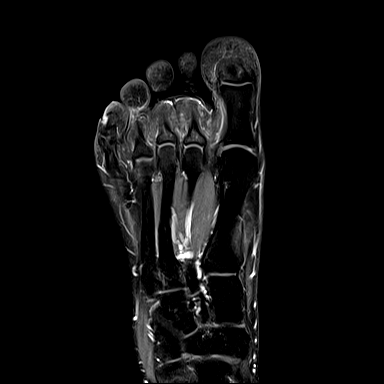
[im 21/21]
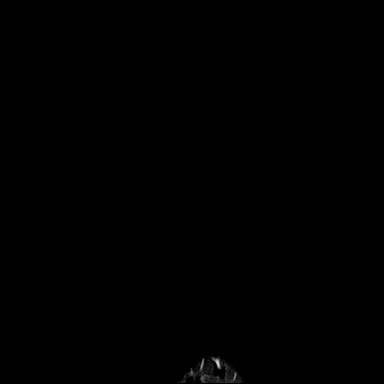

[Series 11: T1 fat-sat · sagittal · right · 2.5mm · 0.50mm/px · 3 of 30 slices shown (3 of 3)]
[im 1/30]
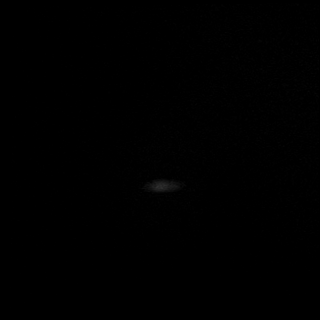
[im 10/30]
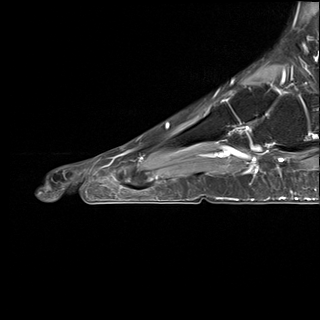
[im 20/30]
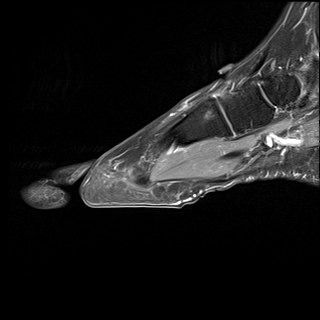

[36 of 40 positions shown; findings below may reference images not displayed]

FINDINGS: The patient's palpable abnormality corresponds to a 10 x 9 mm lesion
in the subcutaneous fat overlying the proximal shaft of the first
metatarsal. This appears to be closely associated with small
adjacent veins and has the same enhancement pattern as the venous
system. Findings most consistent with a venous varix. Recommend
correlation with the ultrasound examination that was done yesterday.

The bony structures are unremarkable. No arthropathic findings. The
foot musculature is unremarkable. The major tendons and ligaments
appear intact.
IMPRESSION: 1. The patient's palpable abnormality has MR imaging features most
consistent with a benign venous varix. Correlation with yesterday's
ultrasound examination may be confirmatory.
2. No other significant findings.

## 2023-08-08 ENCOUNTER — Other Ambulatory Visit (HOSPITAL_COMMUNITY): Payer: Self-pay

## 2023-08-08 ENCOUNTER — Ambulatory Visit (HOSPITAL_COMMUNITY)
Admission: EM | Admit: 2023-08-08 | Discharge: 2023-08-08 | Disposition: A | Discharge status: Home / Self Care | Payer: 59 | Attending: Emergency Medicine | Admitting: Emergency Medicine

## 2023-08-08 ENCOUNTER — Encounter (HOSPITAL_COMMUNITY): Payer: Self-pay

## 2023-08-08 DIAGNOSIS — L0211 Cutaneous abscess of neck: Secondary | ICD-10-CM | POA: Diagnosis not present

## 2023-08-08 MED ORDER — DOXYCYCLINE HYCLATE 100 MG PO CAPS
100.0000 mg | ORAL_CAPSULE | Freq: Two times a day (BID) | ORAL | 0 refills | Status: AC
  Filled 2023-08-08: qty 14, 7d supply, fill #0

## 2023-08-08 NOTE — ED Triage Notes (Signed)
Patient here today with c/o mass on the right side of her neck that has become swollen and painful since Friday. Patient states that the mass has been there for a couple months now. Patient has an appointment with a dermatologist on 08/16/2023. She took IBU with some relief.

## 2023-08-08 NOTE — ED Provider Notes (Signed)
[facility]    CSN: [id number] Arrival date & time: 08/08/23  0800     History   Chief Complaint Chief Complaint  Patient presents with   Cyst    HPI Amy Walsh is a 42 y.o. female.  Here with right side neck mass Has become swollen and painful over the last 4 days, but reports the mass itself has been there for a few months Has taken ibuprofen which is helpful   Not having fever or sore throat No discharge or drainage   Has a dermatology appointment in 8 days  Past Medical History:  Diagnosis Date   Maternal iron deficiency anemia complicating pregnancy 08/19/2011   No pertinent past medical history    Postpartum care following vaginal delivery (10/11) 08/18/2011    Patient Active Problem List   Diagnosis Date Noted   Foot mass, right 11/23/2021   Pre-diabetes 09/22/2018   Anemia, mild 09/22/2018   Active labor at term 12/18/2013   Maternal iron deficiency anemia complicating pregnancy 08/19/2011   Postpartum care following vaginal delivery (2/12) 08/18/2011   IUGR (intrauterine growth restriction) 08/09/2011    Past Surgical History:  Procedure Laterality Date   NO PAST SURGERIES      OB History     Gravida  3   Para  3   Term  3   Preterm  0   AB  0   Living  3      SAB  0   IAB  0   Ectopic  0   Multiple  0   Live Births  2            Home Medications    Prior to Admission medications   Medication Sig Start Date End Date Taking? Authorizing Provider  doxycycline (VIBRAMYCIN) 100 MG capsule Take 1 capsule (100 mg total) by mouth 2 (two) times daily for 7 days. 08/08/23 08/15/23 Yes Mansel Strother, Lurena Joiner, PA-C    Family History Family History  Problem Relation Age of Onset   Hypertension Mother    Arthritis Mother    Cancer Mother    Hypertension Father    Diabetes Father     Social History Social History   Tobacco Use   Smoking status: Never    Passive exposure: Never   Smokeless tobacco: Never   Vaping Use   Vaping status: Never Used  Substance Use Topics   Alcohol use: No   Drug use: No     Allergies   Patient has no known allergies.   Review of Systems Review of Systems As per HPI  Physical Exam Triage Vital Signs ED Triage Vitals [08/08/23 0825]  Encounter Vitals Group     BP 124/79     Systolic BP Percentile      Diastolic BP Percentile      Pulse Rate 90     Resp 16     Temp 98.3 F (36.8 C)     Temp Source Oral     SpO2 97 %     Weight 143 lb (64.9 kg)     Height 5\' 4"  (1.626 m)     Head Circumference      Peak Flow      Pain Score 7     Pain Loc      Pain Education      Exclude from Growth Chart    No data found.  Updated Vital Signs BP 124/79 (BP Location: Left Arm)   Pulse 90  Temp 98.3 F (36.8 C) (Oral)   Resp 16   Ht 5\' 4"  (1.626 m)   Wt 143 lb (64.9 kg)   LMP 07/30/2023 (Exact Date)   SpO2 97%   BMI 24.55 kg/m   Physical Exam Vitals and nursing note reviewed.  Constitutional:      General: She is not in acute distress.    Appearance: Normal appearance.  HENT:     Mouth/Throat:     Mouth: Mucous membranes are moist.     Pharynx: Oropharynx is clear. No posterior oropharyngeal erythema.  Eyes:     Conjunctiva/sclera: Conjunctivae normal.     Pupils: Pupils are equal, round, and reactive to light.  Neck:     Comments: Full ROM of neck without pain. No mass or abscess over spine. No bony tenderness  Cardiovascular:     Rate and Rhythm: Normal rate and regular rhythm.     Pulses: Normal pulses.     Heart sounds: Normal heart sounds.  Pulmonary:     Effort: Pulmonary effort is normal.     Breath sounds: Normal breath sounds.  Musculoskeletal:        General: Normal range of motion.     Cervical back: Normal range of motion. No rigidity.  Skin:    Findings: Abscess present.     Comments: Indurated area on the right lateral neck. About 2 cm diameter. Tender to palpation. There is no fluctuance palpated. No drainage. Area  is erythematous.   Neurological:     Mental Status: She is alert and oriented to person, place, and time.     UC Treatments / Results  Labs (all labs ordered are listed, but only abnormal results are displayed) Labs Reviewed - No data to display  EKG  Radiology No results found.  Procedures Procedures   Medications Ordered in UC Medications - No data to display  Initial Impression / Assessment and Plan / UC Course  I have reviewed the triage vital signs and the nursing notes.  Pertinent labs & imaging results that were available during my care of the patient were reviewed by me and considered in my medical decision making (see chart for details).  Treat for abscess with doxycyline BID x 7 days. Afebrile in clinic. Reassuring the ibuprofen is helpful, continue. Also has derm appointment in 8 days. Will treat infection and then specialist can evaluate underlying. Return and ED precautions. Patient agreeable to plan  Final Clinical Impressions(s) / UC Diagnoses   Final diagnoses:  Abscess of neck     Discharge Instructions      Please take medication as prescribed. Take with food to avoid upset stomach.  Continue ibuprofen - 800 mg every 6 hours You can also alternate with tylenol I recommend warm compress to the area  Please keep dermatology appointment and follow up with them  Please go to the emergency department if symptoms worsen.     ED Prescriptions     Medication Sig Dispense Auth. Provider   doxycycline (VIBRAMYCIN) 100 MG capsule Take 1 capsule (100 mg total) by mouth 2 (two) times daily for 7 days. 14 capsule Jerianne Anselmo, Lurena Joiner, PA-C      PDMP not reviewed this encounter.   Brelyn Woehl, Lurena Joiner, [place] 08/08/23 1025

## 2023-08-08 NOTE — Discharge Instructions (Addendum)
Please take medication as prescribed. Take with food to avoid upset stomach.  Continue ibuprofen - 800 mg every 6 hours You can also alternate with tylenol I recommend warm compress to the area  Please keep dermatology appointment and follow up with them  Please go to the emergency department if symptoms worsen.

## 2023-08-16 ENCOUNTER — Other Ambulatory Visit (HOSPITAL_COMMUNITY): Payer: Self-pay

## 2023-08-16 DIAGNOSIS — M6740 Ganglion, unspecified site: Secondary | ICD-10-CM | POA: Diagnosis not present

## 2023-08-16 DIAGNOSIS — L720 Epidermal cyst: Secondary | ICD-10-CM | POA: Diagnosis not present

## 2023-08-16 MED ORDER — DOXYCYCLINE HYCLATE 100 MG PO CAPS
100.0000 mg | ORAL_CAPSULE | Freq: Two times a day (BID) | ORAL | 0 refills | Status: DC
  Filled 2023-08-16: qty 14, 7d supply, fill #0

## 2023-08-28 ENCOUNTER — Other Ambulatory Visit (HOSPITAL_COMMUNITY): Payer: Self-pay

## 2023-08-28 ENCOUNTER — Encounter (HOSPITAL_COMMUNITY): Payer: Self-pay

## 2023-08-28 ENCOUNTER — Ambulatory Visit (HOSPITAL_COMMUNITY)
Admission: EM | Admit: 2023-08-28 | Discharge: 2023-08-28 | Disposition: A | Discharge status: Home / Self Care | Payer: 59 | Attending: Internal Medicine | Admitting: Internal Medicine

## 2023-08-28 DIAGNOSIS — L0211 Cutaneous abscess of neck: Secondary | ICD-10-CM | POA: Diagnosis not present

## 2023-08-28 MED ORDER — PENTAFLUOROPROP-TETRAFLUOROETH EX AERO
INHALATION_SPRAY | CUTANEOUS | Status: AC
  Filled 2023-08-28: qty 30

## 2023-08-28 MED ORDER — LIDOCAINE HCL 2 % IJ SOLN
INTRAMUSCULAR | Status: AC
  Filled 2023-08-28: qty 20

## 2023-08-28 MED ORDER — AMOXICILLIN-POT CLAVULANATE 875-125 MG PO TABS
1.0000 | ORAL_TABLET | Freq: Two times a day (BID) | ORAL | 0 refills | Status: AC
  Filled 2023-08-28: qty 14, 7d supply, fill #0

## 2023-08-28 NOTE — Discharge Instructions (Addendum)
Please take antibiotics as prescribed.  Please complete your course of antibiotics Daily wound dressing changes recommended Please pull half of the packing out tomorrow and pull out the packing out after 48 hours Please continue taking ibuprofen as needed for pain It is okay to wash the area with soap and water.  Please ensure that you wash all the soap off. Return to urgent care if you have worsening pain or swelling.

## 2023-08-28 NOTE — ED Triage Notes (Signed)
Pt presents to urgent care today with an abscess on right side of neck. Pt states she will see dermatologist this Wednesday 10/23. Pt's last dose of steroid was 10/9, last dose of antibiotic was 10/16. For pain, pt took ibuprofen 600 mg this morning @ 0700. Pt states "it is just so painful, I need some relief."

## 2023-08-28 NOTE — ED Provider Notes (Signed)
[facility]    CSN: [id number] Arrival date & time: 08/28/23  0802      History   Chief Complaint Chief Complaint  Patient presents with   Neck Abscess    HPI Amy Walsh is a 42 y.o. female comes to the urgent care with worsening pain and swelling on the right side of the neck.  Patient symptoms started about a week ago.  She was seen in the urgent care and prescribed antibiotics.  Patient was subsequently seen by the dermatologist who gave her a dose of steroids and extended the antibiotics.  Patient has been on doxycycline since the illness started.  Pain is throbbing, severe and partially relieved by ibuprofen.  Pain is aggravated by palpation.  There is no radiation of pain.  Patient denies any fever or chills.  No difficulty swallowing.  No noisy breathing.  No shortness of breath.  Patient endorses some purulent discharge from the area couple of days ago  HPI  Past Medical History:  Diagnosis Date   Maternal iron deficiency anemia complicating pregnancy 08/19/2011   No pertinent past medical history    Postpartum care following vaginal delivery (10/11) 08/18/2011    Patient Active Problem List   Diagnosis Date Noted   Foot mass, right 11/23/2021   Pre-diabetes 09/22/2018   Anemia, mild 09/22/2018   Active labor at term 12/18/2013   Maternal iron deficiency anemia complicating pregnancy 08/19/2011   Postpartum care following vaginal delivery (2/12) 08/18/2011   IUGR (intrauterine growth restriction) 08/09/2011    Past Surgical History:  Procedure Laterality Date   NO PAST SURGERIES      OB History     Gravida  3   Para  3   Term  3   Preterm  0   AB  0   Living  3      SAB  0   IAB  0   Ectopic  0   Multiple  0   Live Births  2            Home Medications    Prior to Admission medications   Medication Sig Start Date End Date Taking? Authorizing Provider  amoxicillin-clavulanate (AUGMENTIN) 875-125 MG tablet Take  1 tablet by mouth every 12 (twelve) hours for 7 days. 08/28/23 09/04/23 Yes Gurvir Schrom, Britta Mccreedy, MD    Family History Family History  Problem Relation Age of Onset   Hypertension Mother    Arthritis Mother    Cancer Mother    Hypertension Father    Diabetes Father     Social History Social History   Tobacco Use   Smoking status: Never    Passive exposure: Never   Smokeless tobacco: Never  Vaping Use   Vaping status: Never Used  Substance Use Topics   Alcohol use: No   Drug use: No     Allergies   Patient has no known allergies.   Review of Systems Review of Systems As per HPI  Physical Exam Triage Vital Signs ED Triage Vitals  Encounter Vitals Group     BP 08/28/23 0824 (!) 147/83     Systolic BP Percentile --      Diastolic BP Percentile --      Pulse Rate 08/28/23 0824 96     Resp 08/28/23 0824 18     Temp 08/28/23 0824 98.2 F (36.8 C)     Temp Source 08/28/23 0824 Oral     SpO2 08/28/23 0824 99 %  Weight --      Height --      Head Circumference --      Peak Flow --      Pain Score 08/28/23 0826 5     Pain Loc --      Pain Education --      Exclude from Growth Chart --    No data found.  Updated Vital Signs BP (!) 147/83 (BP Location: Left Arm)   Pulse 96   Temp 98.2 F (36.8 C) (Oral)   Resp 18   LMP 08/23/2023 (Approximate)   SpO2 99%   Visual Acuity Right Eye Distance:   Left Eye Distance:   Bilateral Distance:    Right Eye Near:   Left Eye Near:    Bilateral Near:     Physical Exam Vitals and nursing note reviewed.  Constitutional:      General: She is in acute distress.     Appearance: She is not toxic-appearing or diaphoretic.  HENT:     Nose: Nose normal.     Mouth/Throat:     Mouth: Mucous membranes are moist.     Pharynx: No posterior oropharyngeal erythema.  Musculoskeletal:     Comments: Tender swelling on the right side of the neck.  The swelling is mainly in the posterior triangle of the neck.  Swelling  measures about 3 inches in the longest diameter and has a ring of induration around it.  There is mild overlying erythema.  The swelling is tender and fluctuant.  Neurological:     Mental Status: She is alert.      UC Treatments / Results  Labs (all labs ordered are listed, but only abnormal results are displayed) Labs Reviewed - No data to display  EKG   Radiology No results found.  Procedures Incision and Drainage  Date/Time: 08/28/2023 1:32 PM  Performed by: Merrilee Jansky, MD Authorized by: Merrilee Jansky, MD   Consent:    Consent obtained:  Verbal   Consent given by:  Patient   Risks discussed:  Bleeding and incomplete drainage   Alternatives discussed:  No treatment and delayed treatment Universal protocol:    Patient identity confirmed:  Verbally with patient Location:    Type:  Abscess   Location:  Neck   Neck location:  R posterior Pre-procedure details:    Skin preparation:  Povidone-iodine Sedation:    Sedation type:  None Anesthesia:    Anesthesia method:  Local infiltration   Local anesthetic:  Lidocaine 1% w/o epi Procedure type:    Complexity:  Complex Procedure details:    Ultrasound guidance: no     Needle aspiration: no     Incision types:  Single straight   Incision depth:  Subcutaneous   Wound management:  Probed and deloculated   Drainage:  Purulent and bloody   Drainage amount:  Copious   Wound treatment:  Drain placed   Packing materials:  1/4 in iodoform gauze   Amount 1/4" iodoform:  13 inches Post-procedure details:    Procedure completion:  Tolerated well, no immediate complications  (including critical care time)  Medications Ordered in UC Medications - No data to display  Initial Impression / Assessment and Plan / UC Course  I have reviewed the triage vital signs and the nursing notes.  Pertinent labs & imaging results that were available during my care of the patient were reviewed by me and considered in my medical  decision making (see chart for details).  1.  Abscess of the skin of the neck: Incision and drainage completed. Copious amounts of purulent fluid was obtained-about 50 mL of purulent fluid Augmentin 875-125 mg twice daily for 7 days Patient is advised to continue ibuprofen as needed for pain and/or fever Return precautions given. Final Clinical Impressions(s) / UC Diagnoses   Final diagnoses:  Abscess of skin of neck     Discharge Instructions      Please take antibiotics as prescribed.  Please complete your course of antibiotics Daily wound dressing changes recommended Please pull half of the packing out tomorrow and pull out the packing out after 48 hours Please continue taking ibuprofen as needed for pain It is okay to wash the area with soap and water.  Please ensure that you wash all the soap off. Return to urgent care if you have worsening pain or swelling.    ED Prescriptions     Medication Sig Dispense Auth. Provider   amoxicillin-clavulanate (AUGMENTIN) 875-125 MG tablet Take 1 tablet by mouth every 12 (twelve) hours for 7 days. 14 tablet Pope Brunty, Britta Mccreedy, MD      PDMP not reviewed this encounter.   Merrilee Jansky, MD 08/28/23 [phone]

## 2023-09-06 ENCOUNTER — Encounter (HOSPITAL_COMMUNITY): Payer: Self-pay | Admitting: Emergency Medicine

## 2023-09-06 ENCOUNTER — Other Ambulatory Visit: Payer: Self-pay

## 2023-09-06 ENCOUNTER — Ambulatory Visit (HOSPITAL_COMMUNITY)
Admission: EM | Admit: 2023-09-06 | Discharge: 2023-09-06 | Disposition: A | Discharge status: Home / Self Care | Payer: 59 | Attending: Internal Medicine | Admitting: Internal Medicine

## 2023-09-06 DIAGNOSIS — L0211 Cutaneous abscess of neck: Secondary | ICD-10-CM

## 2023-09-06 MED ORDER — PENTAFLUOROPROP-TETRAFLUOROETH EX AERO
INHALATION_SPRAY | CUTANEOUS | Status: AC
  Filled 2023-09-06: qty 30

## 2023-09-06 MED ORDER — LIDOCAINE HCL 2 % IJ SOLN
INTRAMUSCULAR | Status: AC
  Filled 2023-09-06: qty 20

## 2023-09-06 NOTE — ED Provider Notes (Signed)
[facility]    CSN: [id number] Arrival date & time: 09/06/23  0906      History   Chief Complaint Chief Complaint  Patient presents with   Follow-up    HPI Amy Walsh is a 42 y.o. female comes to the urgent care for follow-up of a neck abscess.  Patient presented with a mature abscess on 10/21.  Incision and drainage was done at that time.  Copious amounts of pus was obtained.  Abscess was packed and antibiotics was prescribed.  Patient had completed 2 courses of doxycycline and Augmentin was prescribed at the time of incision and drainage.  Patient comes back today injected with some swelling and fluctuance in the skin of the neck.  This is consistent with loculation and recollection of abscess.  No fever.  Pain is mild and there is no redness. HPI  Past Medical History:  Diagnosis Date   Maternal iron deficiency anemia complicating pregnancy 08/19/2011   No pertinent past medical history    Postpartum care following vaginal delivery (10/11) 08/18/2011    Patient Active Problem List   Diagnosis Date Noted   Foot mass, right 11/23/2021   Pre-diabetes 09/22/2018   Anemia, mild 09/22/2018   Active labor at term 12/18/2013   Maternal iron deficiency anemia complicating pregnancy 08/19/2011   Postpartum care following vaginal delivery (2/12) 08/18/2011   IUGR (intrauterine growth restriction) 08/09/2011    Past Surgical History:  Procedure Laterality Date   NO PAST SURGERIES      OB History     Gravida  3   Para  3   Term  3   Preterm  0   AB  0   Living  3      SAB  0   IAB  0   Ectopic  0   Multiple  0   Live Births  2            Home Medications    Prior to Admission medications   Not on File    Family History Family History  Problem Relation Age of Onset   Hypertension Mother    Arthritis Mother    Cancer Mother    Hypertension Father    Diabetes Father     Social History Social History   Tobacco Use    Smoking status: Never    Passive exposure: Never   Smokeless tobacco: Never  Vaping Use   Vaping status: Never Used  Substance Use Topics   Alcohol use: No   Drug use: No     Allergies   Patient has no known allergies.   Review of Systems Review of Systems As per HPI  Physical Exam Triage Vital Signs ED Triage Vitals  Encounter Vitals Group     BP 09/06/23 0915 120/78     Systolic BP Percentile --      Diastolic BP Percentile --      Pulse Rate 09/06/23 0915 85     Resp 09/06/23 0915 18     Temp 09/06/23 0915 98.6 F (37 C)     Temp Source 09/06/23 0915 Oral     SpO2 09/06/23 0915 98 %     Weight --      Height --      Head Circumference --      Peak Flow --      Pain Score 09/06/23 0914 1     Pain Loc --      Pain Education --  Exclude from Growth Chart --    No data found.  Updated Vital Signs BP 120/78 (BP Location: Left Arm)   Pulse 85   Temp 98.6 F (37 C) (Oral)   Resp 18   LMP 08/23/2023 (Approximate)   SpO2 98%   Visual Acuity Right Eye Distance:   Left Eye Distance:   Bilateral Distance:    Right Eye Near:   Left Eye Near:    Bilateral Near:     Physical Exam Vitals and nursing note reviewed.  Constitutional:      General: She is not in acute distress.    Appearance: She is not ill-appearing or diaphoretic.  HENT:     Nose:     Comments: Fluctuant swelling on the anterior lateral aspect of the neck.  No erythema.  It measures about 2 inches in the longest diameter and has a ring of erythema which is about half an inch.  Palpation of the area resulted in some pus oozing from the old incision site.  Old incision wound is healing well. Cardiovascular:     Rate and Rhythm: Normal rate and regular rhythm.  Neurological:     Mental Status: She is alert.      UC Treatments / Results  Labs (all labs ordered are listed, but only abnormal results are displayed) Labs Reviewed - No data to display  EKG   Radiology No results  found.  Procedures Incision and Drainage  Date/Time: 09/06/2023 9:50 AM  Performed by: Merrilee Jansky, MD Authorized by: Merrilee Jansky, MD   Consent:    Consent obtained:  Verbal   Consent given by:  Patient   Risks, benefits, and alternatives were discussed: yes     Risks discussed:  Bleeding and incomplete drainage   Alternatives discussed:  No treatment Universal protocol:    Patient identity confirmed:  Verbally with patient Location:    Type:  Abscess   Location:  Neck   Neck location:  R anterior Pre-procedure details:    Skin preparation:  Povidone-iodine and antiseptic wash Anesthesia:    Anesthesia method:  Local infiltration   Local anesthetic:  Lidocaine 2% w/o epi Procedure type:    Complexity:  Simple Procedure details:    Incision depth:  Subcutaneous   Wound management:  Probed and deloculated   Drainage:  Bloody and purulent   Drainage amount:  Moderate   Wound treatment:  Wound left open   Packing materials:  None Post-procedure details:    Procedure completion:  Tolerated well, no immediate complications  (including critical care time)  Medications Ordered in UC Medications - No data to display  Initial Impression / Assessment and Plan / UC Course  I have reviewed the triage vital signs and the nursing notes.  Pertinent labs & imaging results that were available during my care of the patient were reviewed by me and considered in my medical decision making (see chart for details).     1.  Abscess of the skin of the neck (incomplete drainage from 10/21): Incision and drainage completed Moderate amount of purulent with bloody abscess was obtained Daily wound dressing changes with topical antibiotic ointment Tylenol ibuprofen as needed for pain Return precautions given. Final Clinical Impressions(s) / UC Diagnoses   Final diagnoses:  Abscess of skin of neck     Discharge Instructions      Daily wound dressing changes until drainage  subsides Please use topical antibiotic ointment when doing wound dressing changes You may wash  the area with soap and water You may take Tylenol or ibuprofen as needed for pain Please feel free to return to urgent care if you have worsening pain, swelling, redness or increasing drainage.   ED Prescriptions   None    PDMP not reviewed this encounter.   Merrilee Jansky, MD 09/06/23 [phone]

## 2023-09-06 NOTE — ED Triage Notes (Signed)
Follow up to visit on 10/21.  Site is improving, but thought to need further draining

## 2023-09-06 NOTE — Discharge Instructions (Signed)
Daily wound dressing changes until drainage subsides Please use topical antibiotic ointment when doing wound dressing changes You may wash the area with soap and water You may take Tylenol or ibuprofen as needed for pain Please feel free to return to urgent care if you have worsening pain, swelling, redness or increasing drainage.

## 2024-03-10 ENCOUNTER — Other Ambulatory Visit (HOSPITAL_COMMUNITY): Payer: Self-pay

## 2024-03-10 MED ORDER — FLUCONAZOLE 150 MG PO TABS
150.0000 mg | ORAL_TABLET | Freq: Once | ORAL | 1 refills | Status: AC
  Filled 2024-03-10: qty 1, 1d supply, fill #0

## 2024-03-11 ENCOUNTER — Other Ambulatory Visit (HOSPITAL_COMMUNITY): Payer: Self-pay

## 2024-07-18 DIAGNOSIS — Z13220 Encounter for screening for lipoid disorders: Secondary | ICD-10-CM | POA: Diagnosis not present

## 2024-07-18 DIAGNOSIS — Z01419 Encounter for gynecological examination (general) (routine) without abnormal findings: Secondary | ICD-10-CM | POA: Diagnosis not present

## 2024-07-18 DIAGNOSIS — R7303 Prediabetes: Secondary | ICD-10-CM | POA: Diagnosis not present

## 2024-07-18 DIAGNOSIS — R928 Other abnormal and inconclusive findings on diagnostic imaging of breast: Secondary | ICD-10-CM | POA: Diagnosis not present

## 2024-10-18 DIAGNOSIS — H5213 Myopia, bilateral: Secondary | ICD-10-CM | POA: Diagnosis not present
